# Patient Record
Sex: Female | Born: 1945 | Race: White | Hispanic: No | Marital: Married | Smoking: Never smoker
Health system: Southern US, Community
[De-identification: ages and names within clinical notes are randomized; demographics above are authoritative.]

## PROBLEM LIST (undated history)

## (undated) DIAGNOSIS — M722 Plantar fascial fibromatosis: Secondary | ICD-10-CM

## (undated) DIAGNOSIS — G4761 Periodic limb movement disorder: Secondary | ICD-10-CM

## (undated) DIAGNOSIS — I10 Essential (primary) hypertension: Secondary | ICD-10-CM

## (undated) DIAGNOSIS — M1990 Unspecified osteoarthritis, unspecified site: Secondary | ICD-10-CM

## (undated) DIAGNOSIS — G4701 Insomnia due to medical condition: Secondary | ICD-10-CM

## (undated) DIAGNOSIS — K649 Unspecified hemorrhoids: Secondary | ICD-10-CM

## (undated) DIAGNOSIS — E785 Hyperlipidemia, unspecified: Secondary | ICD-10-CM

## (undated) HISTORY — DX: Unspecified hemorrhoids: K64.9

## (undated) HISTORY — DX: Essential (primary) hypertension: I10

## (undated) HISTORY — DX: Plantar fascial fibromatosis: M72.2

## (undated) HISTORY — DX: Insomnia due to medical condition: G47.01

## (undated) HISTORY — DX: Periodic limb movement disorder: G47.61

## (undated) HISTORY — DX: Unspecified osteoarthritis, unspecified site: M19.90

## (undated) HISTORY — DX: Hyperlipidemia, unspecified: E78.5

---

## 2008-09-10 DIAGNOSIS — C50919 Malignant neoplasm of unspecified site of unspecified female breast: Secondary | ICD-10-CM

## 2008-09-10 HISTORY — DX: Malignant neoplasm of unspecified site of unspecified female breast: C50.919

## 2010-04-20 DIAGNOSIS — C50812 Malignant neoplasm of overlapping sites of left female breast: Secondary | ICD-10-CM | POA: Insufficient documentation

## 2010-04-20 DIAGNOSIS — Z853 Personal history of malignant neoplasm of breast: Secondary | ICD-10-CM | POA: Insufficient documentation

## 2012-11-11 DIAGNOSIS — L509 Urticaria, unspecified: Secondary | ICD-10-CM | POA: Insufficient documentation

## 2012-12-01 DIAGNOSIS — E2839 Other primary ovarian failure: Secondary | ICD-10-CM | POA: Insufficient documentation

## 2012-12-01 DIAGNOSIS — M1990 Unspecified osteoarthritis, unspecified site: Secondary | ICD-10-CM | POA: Insufficient documentation

## 2012-12-01 DIAGNOSIS — D219 Benign neoplasm of connective and other soft tissue, unspecified: Secondary | ICD-10-CM | POA: Insufficient documentation

## 2012-12-01 DIAGNOSIS — B977 Papillomavirus as the cause of diseases classified elsewhere: Secondary | ICD-10-CM | POA: Insufficient documentation

## 2013-12-30 DIAGNOSIS — N952 Postmenopausal atrophic vaginitis: Secondary | ICD-10-CM | POA: Insufficient documentation

## 2014-07-02 DIAGNOSIS — Z87440 Personal history of urinary (tract) infections: Secondary | ICD-10-CM | POA: Insufficient documentation

## 2014-07-02 DIAGNOSIS — G4761 Periodic limb movement disorder: Secondary | ICD-10-CM | POA: Insufficient documentation

## 2014-07-02 DIAGNOSIS — G4701 Insomnia due to medical condition: Secondary | ICD-10-CM | POA: Insufficient documentation

## 2015-02-23 DIAGNOSIS — F329 Major depressive disorder, single episode, unspecified: Secondary | ICD-10-CM | POA: Insufficient documentation

## 2015-02-23 DIAGNOSIS — F32A Depression, unspecified: Secondary | ICD-10-CM | POA: Insufficient documentation

## 2015-11-06 DIAGNOSIS — M8580 Other specified disorders of bone density and structure, unspecified site: Secondary | ICD-10-CM | POA: Insufficient documentation

## 2016-07-02 DIAGNOSIS — M7061 Trochanteric bursitis, right hip: Secondary | ICD-10-CM | POA: Insufficient documentation

## 2016-07-02 DIAGNOSIS — K5901 Slow transit constipation: Secondary | ICD-10-CM | POA: Insufficient documentation

## 2016-07-02 DIAGNOSIS — M7062 Trochanteric bursitis, left hip: Secondary | ICD-10-CM

## 2016-12-04 DIAGNOSIS — R030 Elevated blood-pressure reading, without diagnosis of hypertension: Secondary | ICD-10-CM | POA: Insufficient documentation

## 2016-12-04 DIAGNOSIS — N858 Other specified noninflammatory disorders of uterus: Secondary | ICD-10-CM | POA: Insufficient documentation

## 2016-12-04 DIAGNOSIS — E7889 Other lipoprotein metabolism disorders: Secondary | ICD-10-CM | POA: Insufficient documentation

## 2017-09-10 DIAGNOSIS — M7502 Adhesive capsulitis of left shoulder: Secondary | ICD-10-CM

## 2017-09-10 HISTORY — DX: Adhesive capsulitis of left shoulder: M75.02

## 2017-09-25 DIAGNOSIS — Z9189 Other specified personal risk factors, not elsewhere classified: Secondary | ICD-10-CM | POA: Insufficient documentation

## 2017-10-09 ENCOUNTER — Ambulatory Visit (INDEPENDENT_AMBULATORY_CARE_PROVIDER_SITE_OTHER): Payer: Medicare Other

## 2017-10-09 ENCOUNTER — Ambulatory Visit (INDEPENDENT_AMBULATORY_CARE_PROVIDER_SITE_OTHER): Payer: Medicare Other | Admitting: Podiatry

## 2017-10-09 ENCOUNTER — Other Ambulatory Visit: Payer: Self-pay | Admitting: Podiatry

## 2017-10-09 ENCOUNTER — Encounter: Payer: Self-pay | Admitting: Podiatry

## 2017-10-09 DIAGNOSIS — M7750 Other enthesopathy of unspecified foot: Secondary | ICD-10-CM

## 2017-10-09 DIAGNOSIS — D3610 Benign neoplasm of peripheral nerves and autonomic nervous system, unspecified: Secondary | ICD-10-CM | POA: Diagnosis not present

## 2017-10-09 DIAGNOSIS — M79676 Pain in unspecified toe(s): Secondary | ICD-10-CM

## 2017-10-09 DIAGNOSIS — M79671 Pain in right foot: Secondary | ICD-10-CM

## 2017-10-09 DIAGNOSIS — M79672 Pain in left foot: Secondary | ICD-10-CM | POA: Diagnosis not present

## 2017-10-09 DIAGNOSIS — M779 Enthesopathy, unspecified: Secondary | ICD-10-CM | POA: Diagnosis not present

## 2017-10-09 NOTE — Progress Notes (Signed)
   Subjective:    Patient ID: Nancy Oconnell, female    DOB: [birth year], 72 y.o.   MRN: [id number]  HPI    Review of Systems  All other systems reviewed and are negative.      Objective:   Physical Exam        Assessment & Plan:

## 2017-10-09 NOTE — Progress Notes (Signed)
Subjective:   Patient ID: Nancy Oconnell, female   DOB: 72 y.o.   MRN: [id number]   HPI Patient presents with painful plantar left over right foot with diagnosis of neuroma and states the pain is been present for years and that she likes to run.  Patient does not smoke and likes to be active   Review of Systems  All other systems reviewed and are negative.       Objective:  Physical Exam  Constitutional: She appears well-developed and well-nourished.  Cardiovascular: Intact distal pulses.  Pulmonary/Chest: Effort normal.  Musculoskeletal: Normal range of motion.  Neurological: She is alert.  Skin: Skin is warm.  Nursing note and vitals reviewed.   Neurovascular status intact muscle strength adequate range of motion within normal limits with patient found to have high arch foot structure with exquisite discomfort second and third intermetatarsal space left over right with radiating discomfort into the adjacent digits.  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  At this point on focusing on the Aromasin I did sterile prep of the left foot and injected directly into the nerve roots prior to breaking his digital branches with a purified alcohol Marcaine solution the second third interspace and went ahead today and scanned for orthotics to try to reduce forefoot instability and pressure.  Reappoint when she returns from [place] in 4 weeks  X-ray indicates that there is a moderate high arch foot structure with no indications of advanced pathology

## 2017-11-06 ENCOUNTER — Ambulatory Visit (INDEPENDENT_AMBULATORY_CARE_PROVIDER_SITE_OTHER): Payer: Medicare Other | Admitting: Orthotics

## 2017-11-06 DIAGNOSIS — D3610 Benign neoplasm of peripheral nerves and autonomic nervous system, unspecified: Secondary | ICD-10-CM

## 2017-11-06 DIAGNOSIS — M79672 Pain in left foot: Principal | ICD-10-CM

## 2017-11-06 DIAGNOSIS — M79671 Pain in right foot: Secondary | ICD-10-CM

## 2017-11-07 NOTE — Progress Notes (Signed)
Patient came in today to pick up custom made foot orthotics.  The goals were accomplished and the patient reported no dissatisfaction with said orthotics.  Patient was advised of breakin period and how to report any issues. 

## 2017-11-21 ENCOUNTER — Ambulatory Visit: Payer: Medicare Other | Admitting: Podiatry

## 2017-11-28 ENCOUNTER — Ambulatory Visit (INDEPENDENT_AMBULATORY_CARE_PROVIDER_SITE_OTHER): Payer: Medicare Other | Admitting: Podiatry

## 2017-11-28 ENCOUNTER — Encounter: Payer: Self-pay | Admitting: Podiatry

## 2017-11-28 DIAGNOSIS — D3610 Benign neoplasm of peripheral nerves and autonomic nervous system, unspecified: Secondary | ICD-10-CM

## 2017-11-28 NOTE — Progress Notes (Signed)
Subjective:   Patient ID: Nancy Oconnell, female   DOB: 72 y.o.   MRN: [id number]   HPI Patient presents stating she still getting burning between the third and fourth toes and stated she had relief for around a week and then had recurrence of her symptoms.  States that the pain is shooting and it feels like there is something caught between the metatarsals   ROS      Objective:  Physical Exam  Neurovascular status intact with positive Biagio Borg sign and shooting pain occurring between the third and fourth toes left     Assessment:  Probability for neuroma symptomatology left     Plan:  H&P reviewed condition and at this point recommended neural lysis injection explaining procedure did sterile prep and I then injected directly into the nerve root prior to breaking into digital branches with a purified alcohol Marcaine solution which was tolerated well.  Reappoint 3 weeks

## 2017-12-19 ENCOUNTER — Ambulatory Visit: Payer: Medicare Other | Admitting: Podiatry

## 2017-12-20 ENCOUNTER — Encounter: Payer: Self-pay | Admitting: Podiatry

## 2017-12-20 ENCOUNTER — Ambulatory Visit (INDEPENDENT_AMBULATORY_CARE_PROVIDER_SITE_OTHER): Payer: Medicare Other | Admitting: Podiatry

## 2017-12-20 DIAGNOSIS — D3610 Benign neoplasm of peripheral nerves and autonomic nervous system, unspecified: Secondary | ICD-10-CM | POA: Diagnosis not present

## 2017-12-23 ENCOUNTER — Telehealth: Payer: Self-pay | Admitting: *Deleted

## 2017-12-23 NOTE — Telephone Encounter (Signed)
"  I'm Dr. Mellody Drown patient.  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  Please call me."

## 2017-12-23 NOTE — Progress Notes (Signed)
Subjective:   Patient ID: Nancy Oconnell, female   DOB: 72 y.o.   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  One more injection into the second interspace and I did a proximal nerve block and then using sterile technique I did inject the second interspace with a purified alcohol Marcaine solution and we will reevaluate again and ultimately again surgery will probably be necessary in this particular case

## 2017-12-30 NOTE — Telephone Encounter (Signed)
"  I'm calling to schedule my surgery with Dr. Paulla Dolly.  I'd like to do it the week of May 20.  He said he would walk me in whenever I got ready to schedule."  He can do it on May 21.  "That date will be good.  What time will it be?"  It will be sometime that morning.  I can't give you an exact time.  "Is there anything else I need to do?"  You will need to register with the surgery center, instructions are in the brochure that was given to you.  "I haven't received a brochure."  Have you signed your consent forms?  I don't think I've signed anything."  You are going to need an appointment to see Dr. Paulla Dolly for a consultation.  "Is that not something I can just come in to sign without seeing him?"  No, you have to see him so he can go over the procedure.  We will give you a bag that has instructions for surgery, a brochure to the surgery center, and a scrub brush to clean your foot.  Would you like me to transfer you to a scheduler so you can schedule an appointment?  "Yes, that will be fine."  The call was transferred to [facility].

## 2018-01-20 ENCOUNTER — Ambulatory Visit (INDEPENDENT_AMBULATORY_CARE_PROVIDER_SITE_OTHER): Payer: Medicare Other | Admitting: Podiatry

## 2018-01-20 ENCOUNTER — Encounter: Payer: Self-pay | Admitting: Podiatry

## 2018-01-20 DIAGNOSIS — D3610 Benign neoplasm of peripheral nerves and autonomic nervous system, unspecified: Secondary | ICD-10-CM

## 2018-01-20 NOTE — Patient Instructions (Signed)
Pre-Operative Instructions  Congratulations, you have decided to take an important step towards improving your quality of life.  You can be assured that the doctors and staff at [facility] will be with you every step of the way.  Here are some important things you should know:  1. Plan to be at the surgery center/hospital at least 1 (one) hour prior to your scheduled time, unless otherwise directed by the surgical center/hospital staff.  You must have a responsible adult accompany you, remain during the surgery and drive you home.  Make sure you have directions to the surgical center/hospital to ensure you arrive on time. 2. If you are having surgery at [facility] or [facility] hospitals, you will need a copy of your medical history and physical form from your family physician within one month prior to the date of surgery. We will give you a form for your primary physician to complete.  3. We make every effort to accommodate the date you request for surgery.  However, there are times where surgery dates or times have to be moved.  We will contact you as soon as possible if a change in schedule is required.   4. No aspirin/ibuprofen for one week before surgery.  If you are on aspirin, any non-steroidal anti-inflammatory medications (Mobic, Aleve, Ibuprofen) should not be taken seven (7) days prior to your surgery.  You make take Tylenol for pain prior to surgery.  5. Medications - If you are taking daily heart and blood pressure medications, seizure, reflux, allergy, asthma, anxiety, pain or diabetes medications, make sure you notify the surgery center/hospital before the day of surgery so they can tell you which medications you should take or avoid the day of surgery. 6. No food or drink after midnight the night before surgery unless directed otherwise by surgical center/hospital staff. 7. No alcoholic beverages 24-hours prior to surgery.  No smoking 24-hours prior or 24-hours after  surgery. 8. Wear loose pants or shorts. They should be loose enough to fit over bandages, boots, and casts. 9. Don't wear slip-on shoes. Sneakers are preferred. 10. Bring your boot with you to the surgery center/hospital.  Also bring crutches or a walker if your physician has prescribed it for you.  If you do not have this equipment, it will be provided for you after surgery. 11. If you have not been contacted by the surgery center/hospital by the day before your surgery, call to confirm the date and time of your surgery. 12. Leave-time from work may vary depending on the type of surgery you have.  Appropriate arrangements should be made prior to surgery with your employer. 13. Prescriptions will be provided immediately following surgery by your doctor.  Fill these as soon as possible after surgery and take the medication as directed. Pain medications will not be refilled on weekends and must be approved by the doctor. 14. Remove nail polish on the operative foot and avoid getting pedicures prior to surgery. 15. Wash the night before surgery.  The night before surgery wash the foot and leg well with water and the antibacterial soap provided. Be sure to pay special attention to beneath the toenails and in between the toes.  Wash for at least three (3) minutes. Rinse thoroughly with water and dry well with a towel.  Perform this wash unless told not to do so by your physician.  Enclosed: 1 Ice pack (please put in freezer the night before surgery)   1 Hibiclens skin cleaner     Pre-op instructions  If you have any questions regarding the instructions, please do not hesitate to call our office.  [place]: [address], [place], [place] [address] -- [phone]  [place]: [address] [place], [place] [address] -- [phone]  [place]: [address]  [place], [place] [address] -- [phone]  [place]: [address], [address], [place], [place] [address] -- [phone]  Website:  [email or web address] 

## 2018-01-21 ENCOUNTER — Telehealth: Payer: Self-pay | Admitting: *Deleted

## 2018-01-21 NOTE — Telephone Encounter (Addendum)
"  I'm scheduled for surgery on May 21.  I'm going to need to cancel it."  Is there a particular reason of why you want to cancel your surgery?  "When I scheduled it, I didn't realize I would need so much down time until I saw him and he told me yesterday.  I'm going to hold off for now."  I will let Dr. Paulla Dolly know and I will cancel the appointment at the surgical center.  I called Caren Griffins at [facility] and canceled the surgery.

## 2018-01-21 NOTE — Telephone Encounter (Signed)
Post op visit was cancelled.

## 2018-01-23 NOTE — Progress Notes (Signed)
Subjective:   Patient ID: Nancy Oconnell, female   DOB: 72 y.o.   MRN: [id number]   HPI Patient presents with exquisite discomfort second interspace left with chronic neuroma-like symptomatology that is not responded conservatively   ROS      Objective:  Physical Exam  Neurovascular status intact with exquisite discomfort second interspace left with what appears to be space-occupying lesion     Assessment:  Probable neuroma symptomatology left second interspace     Plan:  Allow patient to read consent form going over alternative treatments complications.  I explained there is no guarantee this will solve the problem is possible she still may have some symptoms of condition and at this point after extensive review patient signed consent form is given preoperative instructions.  Patient is scheduled for outpatient surgery and understands recovery can take upwards of 6 months and is encouraged to call us with any questions prior to procedure

## 2018-02-05 ENCOUNTER — Other Ambulatory Visit: Payer: Medicare Other

## 2018-10-01 ENCOUNTER — Ambulatory Visit (INDEPENDENT_AMBULATORY_CARE_PROVIDER_SITE_OTHER): Payer: Medicare Other | Admitting: Orthopedic Surgery

## 2018-10-01 ENCOUNTER — Encounter (INDEPENDENT_AMBULATORY_CARE_PROVIDER_SITE_OTHER): Payer: Self-pay

## 2019-09-30 ENCOUNTER — Ambulatory Visit: Payer: Medicare Other | Attending: Family Medicine

## 2019-09-30 DIAGNOSIS — Z23 Encounter for immunization: Secondary | ICD-10-CM | POA: Insufficient documentation

## 2019-09-30 NOTE — Progress Notes (Signed)
   Covid-19 Vaccination Clinic  Name:  Nancy Oconnell    MRN: [id number] DOB: 05-14-1946  09/30/2019  [name] was observed post Covid-19 immunization for 15 minutes without incidence. She was provided with Vaccine Information Sheet and instruction to access the V-Safe system.   Ms. Mean was instructed to call 911 with any severe reactions post vaccine: [name] Difficulty breathing  . Swelling of your face and throat  . A fast heartbeat  . A bad rash all over your body  . Dizziness and weakness    Immunizations Administered    Name Date Dose VIS Date Route   Pfizer COVID-19 Vaccine 09/30/2019 11:17 AM 0.3 mL 08/21/2019 Intramuscular   Manufacturer: [place]   Lot: [id number]   [place]: [id number]

## 2019-10-19 ENCOUNTER — Ambulatory Visit: Payer: Medicare Other | Attending: Internal Medicine

## 2019-10-19 DIAGNOSIS — Z23 Encounter for immunization: Secondary | ICD-10-CM

## 2019-10-19 NOTE — Progress Notes (Signed)
   Covid-19 Vaccination Clinic  Name:  Nancy Oconnell    MRN: [id number] DOB: 1946-07-05  10/19/2019  Nancy Oconnell was observed post Covid-19 immunization for 15 minutes without incidence. She was provided with Vaccine Information Sheet and instruction to access the V-Safe system.   Nancy Oconnell was instructed to call 911 with any severe reactions post vaccine: [name] Difficulty breathing  . Swelling of your face and throat  . A fast heartbeat  . A bad rash all over your body  . Dizziness and weakness    Immunizations Administered    Name Date Dose VIS Date Route   Pfizer COVID-19 Vaccine 10/19/2019  4:13 PM 0.3 mL 08/21/2019 Intramuscular   Manufacturer: [place]   Lot: [id number]   [place]: [id number]

## 2019-10-24 ENCOUNTER — Other Ambulatory Visit: Payer: Self-pay

## 2019-10-24 ENCOUNTER — Emergency Department (HOSPITAL_COMMUNITY)
Admission: EM | Admit: 2019-10-24 | Discharge: 2019-10-24 | Disposition: A | Discharge status: Home / Self Care | Payer: Medicare Other | Attending: Emergency Medicine | Admitting: Emergency Medicine

## 2019-10-24 ENCOUNTER — Encounter (HOSPITAL_COMMUNITY): Payer: Self-pay

## 2019-10-24 ENCOUNTER — Emergency Department (HOSPITAL_COMMUNITY): Payer: Medicare Other

## 2019-10-24 DIAGNOSIS — R0602 Shortness of breath: Secondary | ICD-10-CM | POA: Diagnosis present

## 2019-10-24 LAB — CBC WITH DIFFERENTIAL/PLATELET
Abs Immature Granulocytes: 0.01 10*3/uL (ref 0.00–0.07)
Basophils Absolute: 0 10*3/uL (ref 0.0–0.1)
Basophils Relative: 0 %
Eosinophils Absolute: 0.1 10*3/uL (ref 0.0–0.5)
Eosinophils Relative: 2 %
HCT: 41.3 % (ref 36.0–46.0)
Hemoglobin: 13.7 g/dL (ref 12.0–15.0)
Immature Granulocytes: 0 %
Lymphocytes Relative: 31 %
Lymphs Abs: 1.6 10*3/uL (ref 0.7–4.0)
MCH: 30.4 pg (ref 26.0–34.0)
MCHC: 33.2 g/dL (ref 30.0–36.0)
MCV: 91.8 fL (ref 80.0–100.0)
Monocytes Absolute: 0.5 10*3/uL (ref 0.1–1.0)
Monocytes Relative: 9 %
Neutro Abs: 3 10*3/uL (ref 1.7–7.7)
Neutrophils Relative %: 58 %
Platelets: 175 10*3/uL (ref 150–400)
RBC: 4.5 MIL/uL (ref 3.87–5.11)
RDW: 13.1 % (ref 11.5–15.5)
WBC: 5.1 10*3/uL (ref 4.0–10.5)
nRBC: 0 % (ref 0.0–0.2)

## 2019-10-24 LAB — COMPREHENSIVE METABOLIC PANEL
ALT: 18 U/L (ref 0–44)
AST: 20 U/L (ref 15–41)
Albumin: 4.2 g/dL (ref 3.5–5.0)
Alkaline Phosphatase: 61 U/L (ref 38–126)
Anion gap: 10 (ref 5–15)
BUN: 14 mg/dL (ref 8–23)
CO2: 26 mmol/L (ref 22–32)
Calcium: 9.8 mg/dL (ref 8.9–10.3)
Chloride: 106 mmol/L (ref 98–111)
Creatinine, Ser: 0.57 mg/dL (ref 0.44–1.00)
GFR calc Af Amer: >60 mL/min (ref >60)
GFR calc non Af Amer: >60 mL/min (ref >60)
Glucose, Bld: 107 mg/dL — ABNORMAL HIGH (ref 70–99)
Potassium: 3.9 mmol/L (ref 3.5–5.1)
Sodium: 142 mmol/L (ref 135–145)
Total Bilirubin: 0.2 mg/dL — ABNORMAL LOW (ref 0.3–1.2)
Total Protein: 7.1 g/dL (ref 6.5–8.1)

## 2019-10-24 LAB — D-DIMER, QUANTITATIVE: D-Dimer, Quant: <0.27 ug/mL-FEU (ref 0.00–0.50)

## 2019-10-24 LAB — TROPONIN I (HIGH SENSITIVITY): Troponin I (High Sensitivity): 3 ng/L (ref <18)

## 2019-10-24 LAB — BRAIN NATRIURETIC PEPTIDE: B Natriuretic Peptide: 61.2 pg/mL (ref 0.0–100.0)

## 2019-10-24 NOTE — ED Provider Notes (Signed)
Emergency Department Provider Note   I have reviewed the triage vital signs and the nursing notes.   HISTORY  Chief Complaint Covid Vaccine reaction   HPI Nancy Oconnell is a 74 y.o. female with PMH of breast cancer on Tamoxifen presents to the emergency department for evaluation of shortness of breath over the past week.  Patient reports symptoms starting shortly after receiving her second COVID-19 vaccination.  She did notice some mild shortness of breath after the first vaccine which resolved quickly and was not initially concerning.  She had the second vaccine and reports that the next day she developed shortness of breath which seems to worsen throughout the day and is bad especially in the evenings.  Symptoms are worse when she is sitting and improved with getting up and moving around.  She denies chest pain, heart palpitations.  She is not having fevers, chills, sore throat.  She is not noticing swelling in her legs.  No history of shortness of breath in the past.  No new medications.  She denies itchy rash, tongue/throat swelling.     History reviewed. No pertinent past medical history.  Patient Active Problem List   Diagnosis Date Noted  . 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 Elevated HDL 12/04/2016  . Transient hypertension 12/04/2016  . Uterine mass 12/04/2016  . Slow transit constipation 07/02/2016  . Trochanteric bursitis of both hips 07/02/2016  . Osteopenia determined by x-ray 11/06/2015  . Depression 02/23/2015  . History of recurrent UTI (urinary tract infection) 07/02/2014  . 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 Periodic limb movement disorder (PLMD) 07/02/2014  . Vaginal atrophy 12/30/2013  . Arthritis 12/01/2012  . Estrogen deficiency 12/01/2012  . Fibroids 12/01/2012  . 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 Localized hives 11/11/2012  . History of left breast cancer 04/20/2010  . Malignant neoplasm of overlapping sites of left female breast ([place])  04/20/2010    History reviewed. No pertinent surgical history.  Allergies Other, Alendronate, Lisinopril, Mirtazapine, and Adhesive [tape]  History reviewed. No pertinent family history.  Social History Social History   Tobacco Use  . Smoking status: Unknown If Ever Smoked  . Smokeless tobacco: Never Used  Substance Use Topics  . Alcohol use: Not on file  . Drug use: Not on file    Review of Systems  Constitutional: No fever/chills Eyes: No visual changes. ENT: No sore throat. Cardiovascular: Denies chest pain. Respiratory: Positive shortness of breath. Gastrointestinal: No abdominal pain.  No nausea, no vomiting.  No diarrhea.  No constipation. Genitourinary: Negative for dysuria. Musculoskeletal: Negative for back pain. Skin: Negative for rash. Neurological: Negative for headaches, focal weakness or numbness.  10-point ROS otherwise negative.  ____________________________________________   PHYSICAL EXAM:  VITAL SIGNS: ED Triage Vitals  Enc Vitals Group     BP 10/24/19 1109 (!) 162/110     Pulse Rate 10/24/19 1109 (!) 115     Resp 10/24/19 1109 18     Temp 10/24/19 1109 98.4 F (36.9 C)     Temp Source 10/24/19 1109 Oral     SpO2 10/24/19 1109 98 %   Constitutional: Alert and oriented. Well appearing and in no acute distress. Eyes: Conjunctivae are normal.  Head: Atraumatic. Nose: No congestion/rhinnorhea. Mouth/Throat: Mucous membranes are moist.  Oropharynx non-erythematous. No oropharyngeal edema.  Neck: No stridor.   Cardiovascular: Tachycardia. Good peripheral circulation. Grossly normal heart sounds.   Respiratory: Normal respiratory effort.  No retractions. Lungs CTAB. Gastrointestinal: Soft and nontender.  No distention.  Musculoskeletal: No lower extremity tenderness nor edema.  Neurologic:  Normal speech and language.  Skin:  Skin is warm, dry and intact. No rash noted.  ____________________________________________   LABS (all labs ordered  are listed, but only abnormal results are displayed)  Labs Reviewed  COMPREHENSIVE METABOLIC PANEL - Abnormal; Notable for the following components:      Result Value   Glucose, Bld 107 (*)    Total Bilirubin 0.2 (*)    All other components within normal limits  BRAIN NATRIURETIC PEPTIDE  CBC WITH DIFFERENTIAL/PLATELET  D-DIMER, QUANTITATIVE (NOT AT [facility])  TROPONIN I (HIGH SENSITIVITY)   ____________________________________________  EKG   EKG Interpretation  Date/Time:  Saturday October 24 2019 12:12:42 EST Ventricular Rate:  86 PR Interval:  150 QRS Duration: 78 QT Interval:  370 QTC Calculation: 442 R Axis:   81 Text Interpretation: Normal sinus rhythm Nonspecific ST abnormality Abnormal ECG No STEMI Confirmed by Nanda Quinton [phone]) on 10/24/2019 12:17:30 PM       ____________________________________________  RADIOLOGY  DG Chest Portable 1 View  Result Date: 10/24/2019 CLINICAL DATA:  Shortness of breath. Possible reaction a COVID vaccine. EXAM: PORTABLE CHEST 1 VIEW COMPARISON:  None. FINDINGS: Heart size and mediastinal contours are within normal limits. Lungs are clear. No pleural effusion or pneumothorax is seen. Osseous structures about the chest are unremarkable. IMPRESSION: No active disease. No evidence of pneumonia. Electronically Signed   By: Franki Cabot M.D.   On: 10/24/2019 12:13    ____________________________________________   PROCEDURES  Procedure(s) performed:   Procedures  None  ____________________________________________   INITIAL IMPRESSION / ASSESSMENT AND PLAN / ED COURSE  Pertinent labs & imaging results that were available during my care of the patient were reviewed by me and considered in my medical decision making (see chart for details).   Patient presents emergency department for evaluation of shortness of breath.  She associates the symptoms with her COVID-19 vaccine but pattern is atypical with symptoms worsening throughout  the day and improved with exertion.  No other symptoms to suspect acute vaccine reaction.  Given her history of breast cancer on tamoxifen I have added a D-dimer in addition to screening blood work, EKG, chest x-ray.  Patient's initial vital signs show tachycardia and elevated blood pressure without fever and normal oxygen saturation.   Labs and imaging reviewed. No acute findings. Plan for discharge. Patient has Cardiology follow up next week. Encouraged PCP follow up as well.  ____________________________________________  FINAL CLINICAL IMPRESSION(S) / ED DIAGNOSES  Final diagnoses:  SOB (shortness of breath)     Note:  This document was prepared using Dragon voice recognition software and may include unintentional dictation errors.  Nanda Quinton, MD, [facility] Emergency Medicine    Tynslee Bowlds, [facility], MD 10/24/19 [phone]

## 2019-10-24 NOTE — ED Triage Notes (Signed)
Pt presents with c/o possible reaction to vaccine. Pt reports she had her second covid shot on Monday and since then has been having some shortness of breath. No distress noted, pt able to answer questions and talk in complete sentences.

## 2019-10-24 NOTE — Discharge Instructions (Addendum)
You were seen in the ED today with difficulty breathing. Your labs look normal and your chest x-ray was clear. Please keep your Cardiology appointment next week and call your PCP on Monday to schedule the next available follow up appointment to review your symptoms, ED visit, and direct further testing as needed. Return to the ED with any chest pain, worsening shortness of breath, or other severe symptoms.

## 2019-10-27 ENCOUNTER — Ambulatory Visit (INDEPENDENT_AMBULATORY_CARE_PROVIDER_SITE_OTHER): Payer: Medicare Other | Admitting: Cardiology

## 2019-10-27 ENCOUNTER — Other Ambulatory Visit: Payer: Self-pay

## 2019-10-27 ENCOUNTER — Encounter: Payer: Self-pay | Admitting: Cardiology

## 2019-10-27 VITALS — BP 138/76 | HR 90 | Ht 63.0 in | Wt 124.8 lb

## 2019-10-27 DIAGNOSIS — R002 Palpitations: Secondary | ICD-10-CM | POA: Diagnosis not present

## 2019-10-27 DIAGNOSIS — R030 Elevated blood-pressure reading, without diagnosis of hypertension: Secondary | ICD-10-CM

## 2019-10-27 DIAGNOSIS — E782 Mixed hyperlipidemia: Secondary | ICD-10-CM | POA: Diagnosis not present

## 2019-10-27 DIAGNOSIS — R0602 Shortness of breath: Secondary | ICD-10-CM
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
## 2019-10-27 NOTE — Patient Instructions (Signed)
Medication Instructions:   NO CHANGES  *If you need a refill on your cardiac medications before your next appointment, please call your pharmacy*  Lab Work: NOT  NEEDED   Testing/Procedures: WILL BE SCHEDULE AT [address] Your physician has requested that you have an echocardiogram. Echocardiography is a painless test that uses sound waves to create images of your heart. It provides your doctor with information about the size and shape of your heart and how well your heart's chambers and valves are working. This procedure takes approximately one hour. There are no restrictions for this procedure. And  CT coronary calcium score. . This is $150 out of pocket.   Coronary CalciumScan A coronary calcium scan is an imaging test used to look for deposits of calcium and other fatty materials (plaques) in the inner lining of the blood vessels of the heart (coronary arteries). These deposits of calcium and plaques can partly clog and narrow the coronary arteries without producing any symptoms or warning signs. This puts a person at risk for a heart attack. This test can detect these deposits before symptoms develop. Tell a health care provider about:  Any allergies you have.  All medicines you are taking, including vitamins, herbs, eye drops, creams, and over-the-counter medicines.  Any problems you or family members have had with anesthetic medicines.  Any blood disorders you have.  Any surgeries you have had.  Any medical conditions you have.  Whether you are pregnant or may be pregnant. What are the risks? Generally, this is a safe procedure. However, problems may occur, including:  Harm to a pregnant woman and her unborn baby. This test involves the use of radiation. Radiation exposure can be dangerous to a pregnant woman and her unborn baby. If you are pregnant, you generally should not have this procedure done.  Slight increase in the risk of cancer. This is  because of the radiation involved in the test. What happens before the procedure? No preparation is needed for this procedure. What happens during the procedure?  You will undress and remove any jewelry around your neck or chest.  You will put on a hospital gown.  Sticky electrodes will be placed on your chest. The electrodes will be connected to an electrocardiogram (ECG) machine to record a tracing of the electrical activity of your heart.  A CT scanner will take pictures of your heart. During this time, you will be asked to lie still and hold your breath for 2-3 seconds while a picture of your heart is being taken. The procedure may vary among health care providers and hospitals. What happens after the procedure?  You can get dressed.  You can return to your normal activities.  It is up to you to get the results of your test. Ask your health care provider, or the department that is doing the test, when your results will be ready. Summary  A coronary calcium scan is an imaging test used to look for deposits of calcium and other fatty materials (plaques) in the inner lining of the blood vessels of the heart (coronary arteries).  Generally, this is a safe procedure. Tell your health care provider if you are pregnant or may be pregnant.  No preparation is needed for this procedure.  A CT scanner will take pictures of your heart.  You can return to your normal activities after the scan is done. This information is not intended to replace advice given to you by your health care  provider. Make sure you discuss any questions you have with your health care provider. Document Released: 02/23/2008 Document Revised: 07/16/2016 Document Reviewed: 07/16/2016 Elsevier Interactive Patient Education  2017 [place]: At [facility], you and your health needs are our priority.  As part of our continuing mission to provide you with exceptional heart care, we have created  designated Provider Care Teams.  These Care Teams include your primary Cardiologist (physician) and Advanced Practice Providers (APPs -  Physician Assistants and Nurse Practitioners) who all work together to provide you with the care you need, when you need it.  Your next appointment:   1 month(s)  The format for your next appointment:   In Person  Provider:   Glenetta Hew, MD  Other Instructions  for your palpitation  Use your apple watch to monitor . If the  Symptoms increase and more pronounce  Please notify office and  An Zio  Monitor will be ordered  Here is some information about it Your physician has recommended that you wear a 3- 14  DAY ZIO-PATCH monitor. The Zio patch cardiac monitor continuously records heart rhythm data for up to 14 days, this is for patients being evaluated for multiple types heart rhythms. For the first 24 hours post application, please avoid getting the Zio monitor wet in the shower or by excessive sweating during exercise. After that, feel free to carry on with regular activities. Keep soaps and lotions away from the ZIO XT Patch.  This will be mailed to you, please expect 7-10 days to receive.  [employer] location - [place], [address].

## 2019-10-29 ENCOUNTER — Encounter: Payer: Self-pay | Admitting: Cardiology

## 2019-10-29 NOTE — Assessment & Plan Note (Signed)
She does have an LDL of 111 by last check.  Total cholesterol is less than 200 so in the absence of active symptoms or risk factors, I do not know we need to be overly aggressive.  We talked about potential screening options to determine if we need to be more aggressive in treating lipids.  Stress tests and high sensitive CRP etc. are probably not very functional for screening.  Most beneficial test to assess for potential presence of CAD would be a coronary calcium score.  Plan: Check coronary calcium score.  Based on results, we can determine if her target for LDL should be lower or not.

## 2019-10-29 NOTE — Assessment & Plan Note (Addendum)
She describes having short-lived episodes of racing heart rates that are irregular in nature as far as her heart race ago.  It does sound potentially concerning for atrial fibrillation.  I am not sure how effective the monitor would be because of not having as much for now.  She does have the apple watch model for which does have a telemetry tracking application which she just needs to activate.  Hopefully this will work for her to be allowed capture an episode of erratic heart rates.  I do not necessarily want her to go drinking a lot of alcohol but may be if she does have a drink and has an episode she can bring into telemetry strip.  We will prefer to not treat unless I know what we are treating.  She may benefit from a beta-blocker with resting heart rate of 90 bpm, and lots of anxiety.  Of concern is that both her brother and father had A. fib and erratic heart rates sounds like it very well could be A. fib.  Would be nice to monitor.

## 2019-10-29 NOTE — Assessment & Plan Note (Signed)
Difficult to really assess what her shortness of breath is all about.  The timing of when her symptoms calm makes it less likely be cardiac in nature.  However we can is seen anything pressure.  It could be related to tachycardia.  Plan: Check 2D echocardiogram to evaluate baseline cardiac function.

## 2019-10-29 NOTE — Assessment & Plan Note (Signed)
I think the very fact that she has recordings as well with the 0000000 systolic, I would be very reluctant to put her on a blood pressure medication I agree with having stopped amlodipine.  Perhaps she would benefit from having a as needed short acting medication for uncontrolled hypertension, but it seems like if she sits down and rests, her hypertensive pressures usually resolved.

## 2019-11-09 HISTORY — PX: OTHER SURGICAL HISTORY: SHX169

## 2019-11-09 HISTORY — PX: TRANSTHORACIC ECHOCARDIOGRAM: SHX275

## 2019-11-11 ENCOUNTER — Other Ambulatory Visit: Payer: Self-pay

## 2019-11-11 ENCOUNTER — Ambulatory Visit (INDEPENDENT_AMBULATORY_CARE_PROVIDER_SITE_OTHER)
Admission: RE | Admit: 2019-11-11 | Discharge: 2019-11-11 | Disposition: A | Discharge status: Home / Self Care | Payer: Self-pay | Source: Ambulatory Visit | Attending: Cardiology | Admitting: Cardiology

## 2019-11-11 ENCOUNTER — Ambulatory Visit (HOSPITAL_COMMUNITY): Payer: Medicare Other | Attending: Cardiovascular Disease

## 2019-11-11 DIAGNOSIS — E782 Mixed hyperlipidemia: Secondary | ICD-10-CM

## 2019-11-11 DIAGNOSIS — R002 Palpitations: Secondary | ICD-10-CM

## 2019-11-11 DIAGNOSIS — R0602 Shortness of breath: Secondary | ICD-10-CM | POA: Diagnosis not present

## 2019-11-11 DIAGNOSIS — I10 Essential (primary) hypertension: Secondary | ICD-10-CM | POA: Diagnosis not present

## 2019-11-11 DIAGNOSIS — E785 Hyperlipidemia, unspecified: Secondary | ICD-10-CM | POA: Insufficient documentation

## 2019-11-12 NOTE — Telephone Encounter (Signed)
My Chart:  From: [name] "Nancy Oconnell"    Created: 11/10/2019 7:29 AM     *-*-*This message has not been handled.*-*-*  I have attached an ECG recorded on my Apple Watch. I am sending this because: I captured an episode of Afib last night and you asked me to send it to you. [address]    This patients CHA2DS2-VASc Score and unadjusted Ischemic Stroke Rate (% per year) is equal to 3.2 % stroke rate/year from a score of 3  Above score calculated as 1 point each if present [CHF, HTN, DM, Vascular=MI/PAD/Aortic Plaque, Age if 65-74, or Female] Above score calculated as 2 points each if present [Age > 75, or Stroke/TIA/TE]  This does indeed look like A. Fib.  Amazing with these watches can do for [place] these days. How long that the episode last?   You echocardiogram was done and that look great.  Nothing abnormal.  Normal pump function and normal valves.  We are still waiting on the other test. ->  Now that we have confirmed A. fib, we do need to evaluate for heart artery disease.  Given your age, being a woman and having high blood pressure, you are at increased risk for stroke with A. fib as we discussed.  We probably ought to consider having you on a blood thinner.   My recommendation would be either Eliquis 5 mg twice a day or Xarelto 20 mg daily as the new most commonly used options for blood thinners with A. Fib.  We could also talk about having you on a medication to keep your heart rate a little bit more controlled.  I would prefer to talk about these options with you in person.  I am scheduled to see you back in a couple weeks and we can discuss treatment plans going forward.  Glenetta Hew, MD

## 2019-11-17 ENCOUNTER — Encounter (HOSPITAL_COMMUNITY): Payer: Self-pay | Admitting: Physician Assistant

## 2019-11-17 ENCOUNTER — Ambulatory Visit (HOSPITAL_COMMUNITY)
Admission: RE | Admit: 2019-11-17 | Discharge: 2019-11-17 | Disposition: A | Discharge status: Home / Self Care | Payer: Medicare Other | Source: Ambulatory Visit | Attending: Physician Assistant | Admitting: Physician Assistant

## 2019-11-17 ENCOUNTER — Other Ambulatory Visit: Payer: Self-pay

## 2019-11-17 VITALS — BP 140/88 | HR 88 | Ht 63.0 in | Wt 122.6 lb

## 2019-11-17 DIAGNOSIS — R9431 Abnormal electrocardiogram [ECG] [EKG]: Secondary | ICD-10-CM | POA: Insufficient documentation

## 2019-11-17 DIAGNOSIS — Z853 Personal history of malignant neoplasm of breast: Secondary | ICD-10-CM | POA: Insufficient documentation

## 2019-11-17 DIAGNOSIS — E785 Hyperlipidemia, unspecified: Secondary | ICD-10-CM | POA: Diagnosis not present

## 2019-11-17 DIAGNOSIS — I480 Paroxysmal atrial fibrillation: Secondary | ICD-10-CM | POA: Insufficient documentation

## 2019-11-17 DIAGNOSIS — I10 Essential (primary) hypertension: Secondary | ICD-10-CM | POA: Diagnosis not present

## 2019-11-17 DIAGNOSIS — G4730 Sleep apnea, unspecified: Secondary | ICD-10-CM | POA: Diagnosis not present

## 2019-11-17 DIAGNOSIS — Z79899 Other long term (current) drug therapy: Secondary | ICD-10-CM | POA: Diagnosis not present

## 2019-11-17 DIAGNOSIS — D6869 Other thrombophilia: Secondary | ICD-10-CM | POA: Insufficient documentation

## 2019-11-17 DIAGNOSIS — Z7901 Long term (current) use of anticoagulants: Secondary | ICD-10-CM | POA: Diagnosis not present

## 2019-11-17 HISTORY — DX: Paroxysmal atrial fibrillation: I48.0

## 2019-11-17 MED ORDER — APIXABAN 5 MG PO TABS: 5.0000 mg | ORAL_TABLET | Freq: Two times a day (BID) | ORAL | 3 refills | Status: DC

## 2019-11-17 MED ORDER — METOPROLOL TARTRATE 25 MG PO TABS: 12.5000 mg | ORAL_TABLET | Freq: Two times a day (BID) | ORAL | 3 refills | Status: DC

## 2019-11-17 NOTE — Patient Instructions (Addendum)
Start Eliquis 5mg  twice a day  Start Metoprolol 1/2 tablet twice a day (may take extra 1/2 tablet as needed for breakthrough afib)  Stop caduet (amlodipine/atorvastatin)  Sleep study scheduling will call once approved by your insurance.
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
## 2019-11-18 ENCOUNTER — Telehealth: Payer: Self-pay | Admitting: *Deleted

## 2019-11-18 NOTE — Telephone Encounter (Signed)
Patient notified of COVID and sleep study appointments. She voiced understanding of quarantine instructions.

## 2019-11-26 ENCOUNTER — Encounter: Payer: Self-pay | Admitting: Cardiology

## 2019-11-26 ENCOUNTER — Ambulatory Visit (INDEPENDENT_AMBULATORY_CARE_PROVIDER_SITE_OTHER): Payer: Medicare Other | Admitting: Cardiology

## 2019-11-26 ENCOUNTER — Other Ambulatory Visit: Payer: Self-pay

## 2019-11-26 VITALS — BP 110/60 | HR 63 | Temp 97.5°F | Ht 63.0 in | Wt 124.4 lb

## 2019-11-26 DIAGNOSIS — I480 Paroxysmal atrial fibrillation: Secondary | ICD-10-CM

## 2019-11-26 DIAGNOSIS — R030 Elevated blood-pressure reading, without diagnosis of hypertension: Secondary | ICD-10-CM

## 2019-11-26 DIAGNOSIS — R0602 Shortness of breath: Secondary | ICD-10-CM

## 2019-11-26 DIAGNOSIS — D6869 Other thrombophilia: Secondary | ICD-10-CM

## 2019-11-26 DIAGNOSIS — E782 Mixed hyperlipidemia: Secondary | ICD-10-CM | POA: Diagnosis not present

## 2019-11-26 NOTE — Patient Instructions (Signed)
Medication Instructions:   Continue with current medications especially Eliquis   If you have breakthrough of afib that las to the next morning notify office.   *If you need a refill on your cardiac medications before your next appointment, please call your pharmacy*   Lab Work: Not needed    Testing/Procedures: Not needed   Follow-Up: At [facility], you and your health needs are our priority.  As part of our continuing mission to provide you with exceptional heart care, we have created designated Provider Care Teams.  These Care Teams include your primary Cardiologist (physician) and Advanced Practice Providers (APPs -  Physician Assistants and Nurse Practitioners) who all work together to provide you with the care you need, when you need it.     Your next appointment:   4 month(s)  The format for your next appointment:   In Person  Provider:   Glenetta Hew, MD   Other Instructions  May have occasional glass of wine
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
## 2019-11-30 ENCOUNTER — Encounter: Payer: Self-pay | Admitting: Cardiology

## 2019-11-30 NOTE — Assessment & Plan Note (Signed)
New diagnosis.  Short runs based on her symptoms lasting anywhere from about 20 minutes up to 4 hours. Rates were fast.  She is only on low-dose beta-blocker.  She is on Eliquis which would allow [place] to potentially consider cardioversion.  I want to see what her actual burden is on the beta-blocker.  May need to consider pill in the pocket flecainide if she does have lots of recurrences.  I have asked her to follow these rhythms, and if they persist long enough to go on to the next day, then you may want to consider cardioversion.  However if they are happening more frequently but still short, would probably consider rhythm control with flecainide or propafenone.

## 2019-11-30 NOTE — Assessment & Plan Note (Signed)
She does have up-and-down blood pressures.  I would be leery of treating too aggressively, because her pressures can be low.  I agree with having him stop the amlodipine.  Now she is on Lopressor.  Not sure how far we can push her beta-blocker  with her resting heart rate of 63, but for now would just maintain low-dose beta-blocker.

## 2019-11-30 NOTE — Assessment & Plan Note (Signed)
She does have history of cancer which would have given her hypercoagulable state, but now she has A. fib.  Is on chronic anticoagulation with CHA2DS2-VASc of 3.

## 2019-11-30 NOTE — Assessment & Plan Note (Signed)
Probably had a lot to do with anxiety.  Had normal echocardiogram and normal coronary calcium score.  Likely if not from arrhythmia, is probably related to anxiety or noncardiac etiology.

## 2019-11-30 NOTE — Assessment & Plan Note (Signed)
Cholesterol looks pretty good based on her coronary calcium score.  Would probably try to get below 100, but that can likely be done with diet and exercise.  Can avoid medications at this time.

## 2019-12-03 ENCOUNTER — Telehealth (HOSPITAL_COMMUNITY): Payer: Self-pay

## 2019-12-03 NOTE — Telephone Encounter (Signed)
Patient called and states she recently started on the Metoprolol Tartrate 25mg  tablet on 11/17/19. She is having constipation and only sleeping 3-4 hours at night. She was wondering if this is related to this medication. Per Tilda Franco she needs to stop the medication Metoprolol and only take 1/2 tablet for breakthrough afib. She can restart the medication in three days and contact her pcp if she is no better. Consulted with patient and she understood.

## 2019-12-04 ENCOUNTER — Telehealth: Payer: Self-pay | Admitting: Physician Assistant

## 2019-12-04 ENCOUNTER — Telehealth: Payer: Self-pay | Admitting: Internal Medicine

## 2019-12-04 MED ORDER — DILTIAZEM HCL ER COATED BEADS 120 MG PO CP24: 120.0000 mg | ORAL_CAPSULE | Freq: Every day | ORAL | 0 refills | Status: DC

## 2019-12-04 NOTE — Telephone Encounter (Addendum)
   The patient called the answering service after-hours today. Delay in callback due to call volume and phone number issue. Had recently diagnosed atrial fibrillation. Saw atrial fib clinic with plan for metoprolol 12.5mg  BID and Eliquis. She was having trouble sleeping which was felt possibly related to metoprolol. She says she spoke with AFib clinic yesterday and they recommended to discontinue metoprolol and only take 1/2 tablet PRN breakthrough afib. She is on Eliquis as well.  She did not take metoprolol last night or this morning. She thought she went out of rhythm again this evening - she just tends to feel crummy when in atrial fibrillation. She does not overtly feel palpitations but has noticed correlation using Apple watch. No CP, SOB or other acute symptoms. HR running 111-120s. BP 150/101. She took the 1/2 tablet of metoprolol but irregular HR persists. She is looking for next steps or alternative option. We discussed options including seeking care acutely in ED if having progressive symptoms versus trial of alternative med as outpatient if she simply feels these are the same symptoms she's had recently. She feels the latter is appropriate. We will try a trial of diltiazem CD 120mg  daily. I told her if symptoms do not improve within a few hours of taking the medicine to call us back to discuss next steps. She will hold off further metoprolol for now, pending how she does on diltiazem.  Will route to Dr. Ellyn Hack and Audry Pili in Afib clinic to review and advise on further steps. Might suggest considering updated labs to ensure no new metabolic changes otherwise including thyroid perhaps given disruption in sleep recently.  Pt's pharmacy is closed now so per our discussion she will use [facility]. She was grateful for call.  Charlie Pitter, PA-C

## 2019-12-04 NOTE — Telephone Encounter (Signed)
Cardiology Moonlighter Note  Patient calling to follow up from phone call earlier. Still in AF. BP 136/82, HR 125 bpm. Feeling minimal palpitations but mostly asymptomatic. Hasn't been sleeping well lately so she feels tired. Took diltiazem 120mg  (sustained release) at 8pm, took 12.5mg  metoprolol at 4:30pm. Doesn't think either of these have effected her HR yet.   I told the patient that her sustained release diltiazem will be unlikely to have significant effect on HR within 2 hours after taking.  It may take several days to build up an effective level in her system.  I discussed options with the patient including taking additional 1 or 2 doses of her metoprolol 12.5 mg or alternatively coming to the emergency department for consideration of cardioversion.  She states that she would strongly like to avoid coming to the ED if at all possible.  Given that she is essentially asymptomatic, I think trying to treat this at home tonight is reasonable.  She will take 1 more dose of her metoprolol at this time and continue to monitor her heart rate and blood pressure.  She will call back or come to the ED if she develops any symptoms of syncope, presyncope, chest pain, chest pressure, shortness of breath, or any other new or concerning symptoms.  In the morning she will reassess her heart rate and blood pressure and will either call back or come to the ED if these are still uncontrolled.  Marcie Mowers, MD Cardiology Fellow, PGY-7

## 2019-12-05 ENCOUNTER — Telehealth: Payer: Self-pay | Admitting: Cardiology

## 2019-12-05 ENCOUNTER — Other Ambulatory Visit (HOSPITAL_COMMUNITY)
Admission: RE | Admit: 2019-12-05 | Discharge: 2019-12-05 | Disposition: A | Discharge status: Home / Self Care | Payer: Medicare Other | Source: Ambulatory Visit | Attending: Cardiovascular Disease | Admitting: Cardiovascular Disease

## 2019-12-05 DIAGNOSIS — Z20822 Contact with and (suspected) exposure to covid-19: Secondary | ICD-10-CM | POA: Insufficient documentation

## 2019-12-05 DIAGNOSIS — Z01812 Encounter for preprocedural laboratory examination: Secondary | ICD-10-CM | POA: Diagnosis present

## 2019-12-05 DIAGNOSIS — Z79899 Other long term (current) drug therapy: Secondary | ICD-10-CM | POA: Diagnosis not present

## 2019-12-05 DIAGNOSIS — I480 Paroxysmal atrial fibrillation: Secondary | ICD-10-CM | POA: Insufficient documentation

## 2019-12-05 DIAGNOSIS — R0683 Snoring: Secondary | ICD-10-CM | POA: Insufficient documentation

## 2019-12-05 LAB — SARS CORONAVIRUS 2 (TAT 6-24 HRS): SARS Coronavirus 2: NEGATIVE

## 2019-12-05 NOTE — Telephone Encounter (Signed)
Pt called in reporting confusion over which medications to be taking. Reviewed recent phone notes of switching from metoprolol to Dilt for her PAF. She reports going to bed in afib, now feels she is back in SR. I instructed to continue with her new Diltiazem dose of 120mg  as she has not felt well on the metoprolol. Instructed to keep the metoprolol for breakthrough afib episodes. She was agreeable to this plan and thanked me for the call back. ER precautions given.

## 2019-12-07 ENCOUNTER — Telehealth: Payer: Self-pay | Admitting: Cardiology

## 2019-12-07 ENCOUNTER — Other Ambulatory Visit: Payer: Self-pay

## 2019-12-07 ENCOUNTER — Encounter (HOSPITAL_COMMUNITY): Payer: Self-pay | Admitting: Physician Assistant

## 2019-12-07 ENCOUNTER — Encounter: Payer: Self-pay | Admitting: Physician Assistant

## 2019-12-07 ENCOUNTER — Telehealth (HOSPITAL_COMMUNITY): Payer: Self-pay | Admitting: Physician Assistant

## 2019-12-07 ENCOUNTER — Ambulatory Visit (HOSPITAL_COMMUNITY)
Admission: RE | Admit: 2019-12-07 | Discharge: 2019-12-07 | Disposition: A | Discharge status: Home / Self Care | Payer: Medicare Other | Source: Ambulatory Visit | Attending: Physician Assistant | Admitting: Physician Assistant

## 2019-12-07 VITALS — BP 106/70 | HR 79 | Ht 63.0 in | Wt 124.8 lb

## 2019-12-07 DIAGNOSIS — I10 Essential (primary) hypertension: Secondary | ICD-10-CM | POA: Insufficient documentation

## 2019-12-07 DIAGNOSIS — R0683 Snoring: Secondary | ICD-10-CM | POA: Diagnosis not present

## 2019-12-07 DIAGNOSIS — Z7901 Long term (current) use of anticoagulants: Secondary | ICD-10-CM | POA: Insufficient documentation

## 2019-12-07 DIAGNOSIS — I480 Paroxysmal atrial fibrillation: Secondary | ICD-10-CM | POA: Diagnosis present

## 2019-12-07 DIAGNOSIS — Z79899 Other long term (current) drug therapy: Secondary | ICD-10-CM | POA: Insufficient documentation

## 2019-12-07 DIAGNOSIS — D6869 Other thrombophilia: Secondary | ICD-10-CM

## 2019-12-07 MED ORDER — FLECAINIDE ACETATE 50 MG PO TABS: 50.0000 mg | ORAL_TABLET | Freq: Two times a day (BID) | ORAL | 6 refills | Status: DC

## 2019-12-07 MED ORDER — METOPROLOL TARTRATE 25 MG PO TABS: 12.5000 mg | ORAL_TABLET | Freq: Two times a day (BID) | ORAL | 3 refills | Status: DC | PRN

## 2019-12-07 MED ORDER — DILTIAZEM HCL ER COATED BEADS 120 MG PO CP24: 120.0000 mg | ORAL_CAPSULE | Freq: Every day | ORAL | 6 refills | Status: DC

## 2019-12-07 NOTE — Telephone Encounter (Signed)
Called and left message for patient to call back.  Per Adline Peals, PA, pt needs appt this wk due to recent medication changes & contacting on-call MD this past weekend.

## 2019-12-07 NOTE — Telephone Encounter (Signed)
Patient c/o Palpitations:  High priority if patient c/o lightheadedness, shortness of breath, or chest pain  1) How long have you had palpitations/irregular HR/ Afib? Are you having the symptoms now?  Started Friday-, Sunday was in and last night- not right- She said she talked to our PA on call Friday night and he started her on Diltiazem  2) Are you currently experiencing lightheadedness, SOB or CP? no  3) Do you have a history of afib (atrial fibrillation) or irregular heart rhythm? Yes history of Afib  4) Have you checked your BP or HR? (document readings if available): 102/72 and hear rate 72  5) Are you experiencing any other symptoms? no
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
## 2019-12-07 NOTE — Telephone Encounter (Signed)
Spoke with patient and she is agreeable to appt at 1:30 pm today with Adline Peals, PA.

## 2019-12-07 NOTE — Telephone Encounter (Signed)
Contacted patient- she states that she called Afib clinic on Thursday- to ask about her Metoprolol dose, she was having some issues and they advised her to stop the Metoprolol for 3 days. She did not take it on Thursday, and not on Friday- but Friday morning she woke up and was in AFIB, she states that she tried to do the 1/2 dose of metoprolol to get herself out of AFIB as instructed but it did not work, she called on call doctor Friday evening since offices were closed, note from PA is in chart, she instructed her to take Diltaizem 120 and see if this helped and gets her out of AFIB, patient then states that Saturday morning she woke up and was out of AFIB- but was unsure of what she should take either Dilt or Metoprolol, so she called back in- spoke to another on call doctor and they advised to do the same thing- to take the [place]. But then patient states on Sunday she woke up and was in AFIB again- so she called back and they advised her to continue the [place]- but to use the Metoprolol if needed to get out of AFIB, so she did- she is out of AFIB as of this morning but she had to take 2 tablets of the Metoprolol. Her question now is what she should do for medications- should she take the Dilt or should she continue her normal dose of the Metoprolol- she states her BP now is 102/72 and I advised her to not take anything else now since she is out of AFIB- and I would call her back with further instructions.  All notes from previous encounters are in chart.  Please advise.  Thank you!

## 2019-12-07 NOTE — Telephone Encounter (Signed)
Per phone noted review - patient is to continue diltiazem 120mg  daily and use metoprolol only for breakthrough episodes (see note from 3/27).  Please schedule f/u with Afib clinic ASAP.

## 2019-12-07 NOTE — Telephone Encounter (Signed)
Clint - if you can handle this - would be great.  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  Glenetta Hew, MD

## 2019-12-07 NOTE — Telephone Encounter (Signed)
Thanks for seeing her.   I figured that she was going to need AAD - but was trying to get away with minimal Rx.   Figured Flecainide was best option.    Thanks.  [place]

## 2019-12-07 NOTE — Telephone Encounter (Signed)
Pt returned my call.  She is aware of appt today at 1:30 with Adline Peals, PA.

## 2019-12-07 NOTE — Patient Instructions (Signed)
Start Flecainide 50mg twice a day 

## 2019-12-07 NOTE — Telephone Encounter (Signed)
Contacted patient- advised of message for medication question.   Can we please schedule for the AFIB clinic ASAP - will route to clinic pool to get patient scheduled. Thank you!

## 2019-12-08 ENCOUNTER — Other Ambulatory Visit: Payer: Self-pay

## 2019-12-08 ENCOUNTER — Ambulatory Visit (HOSPITAL_BASED_OUTPATIENT_CLINIC_OR_DEPARTMENT_OTHER): Payer: Medicare Other | Admitting: Cardiovascular Disease

## 2019-12-08 DIAGNOSIS — I480 Paroxysmal atrial fibrillation: Secondary | ICD-10-CM

## 2019-12-08 DIAGNOSIS — Z01812 Encounter for preprocedural laboratory examination: Secondary | ICD-10-CM | POA: Diagnosis not present

## 2019-12-08 DIAGNOSIS — R0683 Snoring: Secondary | ICD-10-CM | POA: Diagnosis not present

## 2019-12-09 NOTE — Telephone Encounter (Signed)
Great -- thanks  Glenetta Hew, MD

## 2019-12-11 ENCOUNTER — Ambulatory Visit (HOSPITAL_COMMUNITY)
Admission: RE | Admit: 2019-12-11 | Discharge: 2019-12-11 | Disposition: A | Discharge status: Home / Self Care | Payer: Medicare Other | Source: Ambulatory Visit | Attending: Nurse Practitioner | Admitting: Nurse Practitioner

## 2019-12-11 VITALS — BP 150/86 | HR 70

## 2019-12-11 DIAGNOSIS — I480 Paroxysmal atrial fibrillation: Secondary | ICD-10-CM

## 2019-12-11 NOTE — Progress Notes (Signed)
Pt is in for EKG for start of flecainide 50 mg bid for PAF. Ekg shows SR at 70 bpm, pr int 166 ms, qrs 80 ms, qtc 423 ms. She had some dizziness to start with flecainide but that has resolved. She is an avid exerciser so witll scheduled treadmill test to look for interval changes while exercising. She has f/u with Adline Peals in 2 months.

## 2019-12-14 NOTE — Procedures (Signed)
    Patient Name: Nancy Oconnell, Nancy Oconnell Date: 12/08/2019 Gender: Female D.O.B: [birth year] Age (years): 89 Referring Provider: Malka So PA Height (inches): 63 Interpreting Physician: Fransico Him MD, ABSM Weight (lbs): 122 RPSGT: Zadie Rhine BMI: 22 MRN: [id number] Neck Size: 12.00  CLINICAL INFORMATION Sleep Study Type: NPSG  Indication for sleep study: Snoring  Epworth Sleepiness Score: 4  SLEEP STUDY TECHNIQUE As per the AASM Manual for the Scoring of Sleep and Associated Events v2.3 (April 2016) with a hypopnea requiring 4% desaturations.  The channels recorded and monitored were frontal, central and occipital EEG, electrooculogram (EOG), submentalis EMG (chin), nasal and oral airflow, thoracic and abdominal wall motion, anterior tibialis EMG, snore microphone, electrocardiogram, and pulse oximetry.  MEDICATIONS Medications self-administered by patient taken the night of the study : [place] The study was initiated at 10:15:08 PM and ended at 4:32:10 AM.  Sleep onset time was 35.5 minutes and the sleep efficiency was 25.6%. The total sleep time was 96.5 minutes.  Stage REM latency was N/A minutes.  The patient spent 21.8% of the night in stage N1 sleep, 64.8% in stage N2 sleep, 13.5% in stage N3 and 0% in REM.  Alpha intrusion was absent.  Supine sleep was 15.53%.  RESPIRATORY PARAMETERS The overall apnea/hypopnea index (AHI) was 1.2 per hour. There were 0 total apneas, including 0 obstructive, 0 central and 0 mixed apneas. There were 2 hypopneas and 46 RERAs.  The AHI during Stage REM sleep was N/A per hour.  AHI while supine was 8.0 per hour.  The mean oxygen saturation was 94.0%. The minimum SpO2 during sleep was 89.0%.  moderate snoring was noted during this study.  CARDIAC DATA The 2 lead EKG demonstrated sinus rhythm. The mean heart rate was 65.2 beats per minute. Other EKG findings include: None.  LEG MOVEMENT DATA The total  PLMS were 0 with a resulting PLMS index of 0.0. Associated arousal with leg movement index was 3.7 .  IMPRESSIONS - Nondiagnostic study due to inadequate sleep time.  - No significant central sleep apnea occurred during this study (CAI = 0.0/h). - The patient had minimal or no oxygen desaturation during the study (Min O2 = 89.0%) - The patient snored with moderate snoring volume. - No cardiac abnormalities were noted during this study. - Clinically significant periodic limb movements did not occur during sleep. No significant associated arousals.  DIAGNOSIS - Nondiagnostic Study  RECOMMENDATIONS - Avoid alcohol, sedatives and other CNS depressants that may worsen sleep apnea and disrupt normal sleep architecture. - Sleep hygiene should be reviewed to assess factors that may improve sleep quality. - Weight management and regular exercise should be initiated or continued if appropriate. - Recommend Home Sleep Study or repeat in lab study with sleep aide.   [Electronically signed] 12/14/2019 07:05 PM  Fransico Him MD, ABSM Diplomate, American Board of Sleep Medicine

## 2019-12-16 ENCOUNTER — Other Ambulatory Visit (HOSPITAL_COMMUNITY): Payer: Self-pay | Admitting: Physician Assistant

## 2019-12-17 ENCOUNTER — Telehealth: Payer: Self-pay | Admitting: *Deleted

## 2019-12-17 NOTE — Telephone Encounter (Signed)
-----   Message from Sueanne Margarita, MD sent at 12/14/2019  7:09 PM EDT ----- Nondiagnostic sleep study due to inadequate sleep time.  Sleep study needs to be repeated.  Please find out if patient wants to have in lab study with sleep aide or home sleep study

## 2019-12-17 NOTE — Telephone Encounter (Signed)
Informed patient of sleep study results and patient understanding was verbalized. Patient understands her sleep study showed Nondiagnostic sleep study due to inadequate sleep time. Sleep study needs to be repeated. Please find out if patient wants to have in lab study with sleep aide or home sleep study   Pt is aware and agreeable to her results.   Pt would like the Home Sleep Study.  Home sleep Study sent to sleep pool.

## 2019-12-19 ENCOUNTER — Other Ambulatory Visit (HOSPITAL_COMMUNITY)
Admission: RE | Admit: 2019-12-19 | Discharge: 2019-12-19 | Disposition: A | Discharge status: Home / Self Care | Payer: Medicare Other | Source: Ambulatory Visit | Attending: Physician Assistant | Admitting: Physician Assistant

## 2019-12-19 DIAGNOSIS — Z20822 Contact with and (suspected) exposure to covid-19: Secondary | ICD-10-CM | POA: Insufficient documentation

## 2019-12-19 DIAGNOSIS — Z01812 Encounter for preprocedural laboratory examination: Secondary | ICD-10-CM | POA: Insufficient documentation

## 2019-12-19 LAB — SARS CORONAVIRUS 2 (TAT 6-24 HRS): SARS Coronavirus 2: NEGATIVE

## 2019-12-21 ENCOUNTER — Encounter: Payer: Self-pay | Admitting: *Deleted

## 2019-12-21 DIAGNOSIS — R0602 Shortness of breath: Secondary | ICD-10-CM

## 2019-12-21 NOTE — Telephone Encounter (Signed)
This encounter was created in error - please disregard.

## 2019-12-21 NOTE — Telephone Encounter (Signed)
  Freada Bergeron, CMA  Freada Bergeron, CMA         ----- Message -----  From: Freada Bergeron, CMA  Sent: 12/17/2019  5:38 PM EDT  To: [facility]  Subject: precert                      Home Sleep Study

## 2019-12-21 NOTE — Telephone Encounter (Signed)
-----   Message from Freada Bergeron, [place] sent at 12/21/2019  1:28 PM EDT ----- Regarding: FW: precert  ----- Message ----- From: Freada Bergeron, CMA Sent: 12/17/2019   5:38 PM EDT To: [facility] Sleep Studies Subject: precert                                        Home Sleep Study

## 2019-12-21 NOTE — Telephone Encounter (Signed)
Patient is aware and agreeable to Home Sleep Study through [facility]. Patient is scheduled for 03/03/20 at 9:30 to pick up home sleep kit and meet with Respiratory therapist at [facility]. Patient is aware that if this appointment date and time does not work for them they should contact Artis Delay directly at [phone]. Patient is aware that a sleep packet will be sent from [facility] in week. Patient is agreeable to treatment and thankful for call.

## 2019-12-22 ENCOUNTER — Other Ambulatory Visit: Payer: Self-pay

## 2019-12-22 ENCOUNTER — Encounter (HOSPITAL_COMMUNITY): Payer: Self-pay | Admitting: *Deleted

## 2019-12-22 ENCOUNTER — Ambulatory Visit (INDEPENDENT_AMBULATORY_CARE_PROVIDER_SITE_OTHER): Payer: Medicare Other

## 2019-12-22 ENCOUNTER — Other Ambulatory Visit (HOSPITAL_COMMUNITY): Payer: Self-pay | Admitting: Physician Assistant

## 2019-12-22 DIAGNOSIS — Z79899 Other long term (current) drug therapy: Secondary | ICD-10-CM

## 2019-12-22 DIAGNOSIS — I480 Paroxysmal atrial fibrillation: Secondary | ICD-10-CM

## 2019-12-22 DIAGNOSIS — Z5181 Encounter for therapeutic drug level monitoring: Secondary | ICD-10-CM

## 2019-12-22 LAB — EXERCISE TOLERANCE TEST
Estimated workload: 9.9 METS
Exercise duration (min): 6 min
Exercise duration (sec): 59 s
MPHR: 149 {beats}/min
Peak HR: 153 {beats}/min
Percent HR: 102 %
RPE: 15
Rest HR: 82 {beats}/min

## 2019-12-23 ENCOUNTER — Telehealth: Payer: Self-pay | Admitting: Cardiology

## 2019-12-27 ENCOUNTER — Other Ambulatory Visit: Payer: Self-pay | Admitting: Physician Assistant

## 2020-01-01 ENCOUNTER — Other Ambulatory Visit (HOSPITAL_COMMUNITY): Payer: Medicare Other

## 2020-01-04 ENCOUNTER — Other Ambulatory Visit (HOSPITAL_COMMUNITY): Payer: Self-pay | Admitting: *Deleted

## 2020-01-04 MED ORDER — FLECAINIDE ACETATE 50 MG PO TABS: 50.0000 mg | ORAL_TABLET | Freq: Two times a day (BID) | ORAL | 2 refills | Status: DC

## 2020-01-04 MED ORDER — APIXABAN 5 MG PO TABS: 5.0000 mg | ORAL_TABLET | Freq: Two times a day (BID) | ORAL | 2 refills | Status: DC

## 2020-01-04 MED ORDER — DILTIAZEM HCL ER COATED BEADS 120 MG PO CP24: ORAL_CAPSULE | ORAL | 2 refills | Status: DC

## 2020-01-07 ENCOUNTER — Telehealth (HOSPITAL_COMMUNITY): Payer: Self-pay | Admitting: *Deleted

## 2020-01-07 NOTE — Telephone Encounter (Signed)
Whomever is doing the procedure needs to send a request to stop anticoagulation to the office.  We need to know what they are wanting as far as number of days off medication.  Have them fax the request to the main [facility] fax [phone]) and we'll respond to it that way.    Thanks,  Erasmo Downer

## 2020-01-07 NOTE — Telephone Encounter (Signed)
Patient having D&C on 5/21 at [facility] - needing instructions for Eliquis preop and postop.

## 2020-01-07 NOTE — Telephone Encounter (Signed)
Request sent to Dr. Sherrell Puller to provide clearance request for pending surgery.

## 2020-01-08 ENCOUNTER — Encounter (HOSPITAL_COMMUNITY): Payer: Self-pay
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
## 2020-02-03 ENCOUNTER — Ambulatory Visit (HOSPITAL_COMMUNITY)
Admission: RE | Admit: 2020-02-03 | Discharge: 2020-02-03 | Disposition: A | Discharge status: Home / Self Care | Payer: Medicare Other | Source: Ambulatory Visit | Attending: Physician Assistant | Admitting: Physician Assistant

## 2020-02-03 ENCOUNTER — Encounter (HOSPITAL_COMMUNITY): Payer: Self-pay | Admitting: Physician Assistant

## 2020-02-03 ENCOUNTER — Other Ambulatory Visit: Payer: Self-pay

## 2020-02-03 VITALS — BP 130/80 | HR 74 | Ht 63.0 in | Wt 124.8 lb

## 2020-02-03 DIAGNOSIS — R0683 Snoring: Secondary | ICD-10-CM | POA: Insufficient documentation

## 2020-02-03 DIAGNOSIS — Z888 Allergy status to other drugs, medicaments and biological substances status: Secondary | ICD-10-CM | POA: Diagnosis not present

## 2020-02-03 DIAGNOSIS — I480 Paroxysmal atrial fibrillation: Secondary | ICD-10-CM

## 2020-02-03 DIAGNOSIS — Z923 Personal history of irradiation: Secondary | ICD-10-CM | POA: Insufficient documentation

## 2020-02-03 DIAGNOSIS — Z9010 Acquired absence of unspecified breast and nipple: Secondary | ICD-10-CM | POA: Insufficient documentation

## 2020-02-03 DIAGNOSIS — Z8249 Family history of ischemic heart disease and other diseases of the circulatory system: Secondary | ICD-10-CM | POA: Insufficient documentation

## 2020-02-03 DIAGNOSIS — G4761 Periodic limb movement disorder: Secondary | ICD-10-CM | POA: Diagnosis not present

## 2020-02-03 DIAGNOSIS — Z7901 Long term (current) use of anticoagulants: Secondary | ICD-10-CM | POA: Insufficient documentation

## 2020-02-03 DIAGNOSIS — Z853 Personal history of malignant neoplasm of breast: Secondary | ICD-10-CM | POA: Insufficient documentation

## 2020-02-03 DIAGNOSIS — Z79899 Other long term (current) drug therapy: Secondary | ICD-10-CM | POA: Insufficient documentation

## 2020-02-03 DIAGNOSIS — E785 Hyperlipidemia, unspecified: Secondary | ICD-10-CM | POA: Insufficient documentation

## 2020-02-03 DIAGNOSIS — Z91048 Other nonmedicinal substance allergy status: Secondary | ICD-10-CM | POA: Diagnosis not present

## 2020-02-03 DIAGNOSIS — I10 Essential (primary) hypertension: Secondary | ICD-10-CM | POA: Insufficient documentation

## 2020-02-03 DIAGNOSIS — M1990 Unspecified osteoarthritis, unspecified site: Secondary | ICD-10-CM | POA: Diagnosis not present

## 2020-02-03 DIAGNOSIS — D6869 Other thrombophilia: Secondary | ICD-10-CM | POA: Diagnosis not present

## 2020-02-03 DIAGNOSIS — R0681 Apnea, not elsewhere classified: Secondary | ICD-10-CM | POA: Insufficient documentation

## 2020-02-03 MED ORDER — METOPROLOL SUCCINATE ER 25 MG PO TB24: 12.5000 mg | ORAL_TABLET | Freq: Every day | ORAL | 2 refills | Status: DC

## 2020-03-02 ENCOUNTER — Telehealth (HOSPITAL_COMMUNITY): Payer: Self-pay | Admitting: *Deleted

## 2020-03-02 NOTE — Telephone Encounter (Signed)
Patient called in stating she has an upcoming HST and is worried the metoprolol will prevent her from getting the sleep needed. The metoprolol has helped her constipation tremendously but has effected her sleep some. Per Adline Peals PA ok to skip metoprolol night of HST and resume next day. Pt verbalized understanding.

## 2020-03-03 ENCOUNTER — Ambulatory Visit (HOSPITAL_BASED_OUTPATIENT_CLINIC_OR_DEPARTMENT_OTHER): Payer: Medicare Other | Attending: Physician Assistant | Admitting: Cardiovascular Disease

## 2020-03-03 ENCOUNTER — Other Ambulatory Visit: Payer: Self-pay

## 2020-03-03 DIAGNOSIS — R0602 Shortness of breath: Secondary | ICD-10-CM | POA: Diagnosis not present

## 2020-03-03 DIAGNOSIS — Z79899 Other long term (current) drug therapy: Secondary | ICD-10-CM | POA: Diagnosis not present

## 2020-03-03 DIAGNOSIS — G4733 Obstructive sleep apnea (adult) (pediatric): Secondary | ICD-10-CM | POA: Diagnosis not present

## 2020-03-03 DIAGNOSIS — Z7901 Long term (current) use of anticoagulants: Secondary | ICD-10-CM | POA: Diagnosis not present

## 2020-03-09 ENCOUNTER — Encounter (HOSPITAL_BASED_OUTPATIENT_CLINIC_OR_DEPARTMENT_OTHER): Payer: Self-pay | Admitting: Cardiovascular Disease

## 2020-03-09 NOTE — Procedures (Signed)
     Patient Name: Nancy Oconnell, Nancy Oconnell Date: 03/03/2020 Gender: Female D.O.B: [birth year] Age (years): 74 Referring Provider: Malka So PA Height (inches): 63 Interpreting Physician: Shelva Majestic MD, ABSM Weight (lbs): 122 RPSGT: [place], Holly BMI: 22 MRN: [id number] Neck Size: 12.00  CLINICAL INFORMATION Sleep Study Type: HST  Indication for sleep study: N/A  Epworth Sleepiness Score: 2  SLEEP STUDY TECHNIQUE A multi-channel overnight portable sleep study was performed. The channels recorded were: nasal airflow, thoracic respiratory movement, and oxygen saturation with a pulse oximetry. Snoring was also monitored.  MEDICATIONS acetaminophen (TYLENOL) 500 MG tablet apixaban (ELIQUIS) 5 MG TABS tablet Estradiol 10 MCG TABS vaginal tablet flecainide (TAMBOCOR) 50 MG tablet metoprolol succinate (TOPROL XL) 25 MG 24 hr tablet metoprolol tartrate (LOPRESSOR) 25 MG tablet(Expired) mirtazapine (REMERON) 15 MG tablet nitrofurantoin (MACRODANTIN) 50 MG capsule senna-docusate (SENNA S) 8.6-50 MG tablet tamoxifen (NOLVADEX) 20 MG tablet Patient self administered medications include: REMERON.  SLEEP ARCHITECTURE Patient was studied for 340 minutes. The sleep efficiency was 100.0 % and the patient was supine for 37.4%. The arousal index was 0.0 per hour.  RESPIRATORY PARAMETERS The overall AHI was 6.4 per hour, with a central apnea index of 0.5 per hour.  The oxygen nadir was 86% during sleep. Time <89% was 4.8 minutes.  CARDIAC DATA Mean heart rate during sleep was 63.9 bpm.  IMPRESSIONS - Mild obstructive sleep apnea occurred during this study (AHI = 6.4/h). The severity during REM sleep cannot be assessed on this home study. - No significant central sleep apnea occurred during this study (CAI = 0.5/h). - Moderate oxygen desaturation to a nadir of 86%. - No snoring was audible during this study.  DIAGNOSIS - Obstructive Sleep Apnea (327.23 [G47.33  ICD-10])  RECOMMENDATIONS - Therapeutic CPAP titration to determine optimal pressure required to alleviate sleep disordered breathing. - Effort should be made to optimize nasal and oropharyngeal patency. - Alternatives to CPAP therapy such as a customized oral appliance may be considered. - Avoid alcohol, sedatives and other CNS depressants that may worsen sleep apnea and disrupt normal sleep architecture. - Sleep hygiene should be reviewed to assess factors that may improve sleep quality. - Weight management and regular exercise should be initiated or continued. - Patient may benefit from in-lab study  [Electronically signed] 03/09/2020 05:24 PM  Shelva Majestic MD, [facility], ABSM Diplomate, American Board of Sleep Medicine   NPI: [id number] [place] PH: [phone]   FX: [phone] [place]

## 2020-03-10 ENCOUNTER — Other Ambulatory Visit: Payer: Self-pay

## 2020-03-10 ENCOUNTER — Ambulatory Visit (INDEPENDENT_AMBULATORY_CARE_PROVIDER_SITE_OTHER): Payer: Medicare Other | Admitting: Cardiology

## 2020-03-10 ENCOUNTER — Encounter: Payer: Self-pay | Admitting: Cardiology

## 2020-03-10 VITALS — BP 120/78 | HR 80 | Temp 97.2°F | Ht 63.0 in | Wt 128.0 lb

## 2020-03-10 DIAGNOSIS — R030 Elevated blood-pressure reading, without diagnosis of hypertension: Secondary | ICD-10-CM | POA: Diagnosis not present

## 2020-03-10 DIAGNOSIS — I480 Paroxysmal atrial fibrillation: Secondary | ICD-10-CM

## 2020-03-10 DIAGNOSIS — E782 Mixed hyperlipidemia: Secondary | ICD-10-CM | POA: Diagnosis not present

## 2020-03-10 NOTE — Patient Instructions (Signed)
Medication Instructions:  No changes *If you need a refill on your cardiac medications before your next appointment, please call your pharmacy*   Lab Work: Not needed   Testing/Procedures: Not needed   Follow-Up: At [facility], you and your health needs are our priority.  As part of our continuing mission to provide you with exceptional heart care, we have created designated Provider Care Teams.  These Care Teams include your primary Cardiologist (physician) and Advanced Practice Providers (APPs -  Physician Assistants and Nurse Practitioners) who all work together to provide you with the care you need, when you need it.    Your next appointment:   2 month(s)  The format for your next appointment:   Virtual Visit   Provider:   Glenetta Hew, MD   Other Instructions
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
## 2020-03-17 ENCOUNTER — Encounter: Payer: Self-pay | Admitting: Cardiology

## 2020-03-17 NOTE — Assessment & Plan Note (Signed)
She seems to be doing pretty well with flecainide as far as rhythm control.  Unfortunately being on flecainide, she does have to be on an AV nodal agent.  I would like to see how she does over the next month or 2 with the low-dose Toprol.  She will take it earlier in the evening.  Still use as needed breakthrough Lopressor.    Maintain sinus rhythm with flecainide -> for breakthrough episode she would take a total of 200 mg split up in 2 doses whether it is additional 50+ the dose she already had and then that the next dose would be 100 or 100 mg altogether x2 (every 12 hours)

## 2020-03-17 NOTE — Assessment & Plan Note (Signed)
Lipids are reasonable for her low coronary calcium score.  Would like to see LDL lower than 100, but for now until her GI issues are resolved and we stabilize her A. fib meds I would not make any changes.

## 2020-03-17 NOTE — Assessment & Plan Note (Signed)
She does have some labile blood pressures.  They are pretty well controlled today.  I think if she is able to maintain relatively stable pressures I would not add another medication until we stabilize her current meds.

## 2020-03-22 ENCOUNTER — Telehealth: Payer: Self-pay | Admitting: *Deleted

## 2020-03-22 DIAGNOSIS — G4733 Obstructive sleep apnea (adult) (pediatric): Secondary | ICD-10-CM

## 2020-03-22 NOTE — Addendum Note (Signed)
Addended by: Freada Bergeron on: 03/22/2020 06:22 PM   Modules accepted: Orders

## 2020-03-22 NOTE — Telephone Encounter (Signed)
-----   Message from Nancy Sine, MD sent at 03/09/2020  5:32 PM EDT ----- [name] Please notify pt of results and discuss CPAP titration study or alternatives

## 2020-03-22 NOTE — Telephone Encounter (Signed)
Informed patient of sleep study results and patient understanding was verbalized. Patient understands her sleep study showed:   IMPRESSIONS - Mild obstructive sleep apnea occurred during this study (AHI = 6.4/h). The severity during REM sleep cannot be assessed on this home study.  - Moderate oxygen desaturation to a nadir of 86%.  DIAGNOSIS - Obstructive Sleep Apnea (327.23 [G47.33 ICD-10])  RECOMMENDATIONS - Therapeutic CPAP titration to determine optimal pressure required to alleviate sleep disordered breathing.  - Alternatives to CPAP therapy such as a customized oral appliance may be considered.  Pt is aware and agreeable to her results  Titration ordered

## 2020-03-23 ENCOUNTER — Telehealth: Payer: Self-pay | Admitting: *Deleted

## 2020-03-23 NOTE — Telephone Encounter (Signed)
Patient is scheduled for lab study on 04/27/20. Patient understands her sleep study will be done at [facility] sleep lab. Patient understands she will receive a sleep packet in a week or so. Patient understands to call if she does not receive the sleep packet in a timely manner. Patient agrees with treatment and thanked me for call.

## 2020-03-23 NOTE — Telephone Encounter (Signed)
Patient is concerned she will not sleep just like the study in April. She states her sleep is worse since statrting the Afib drugs. Please advise

## 2020-04-20 ENCOUNTER — Other Ambulatory Visit: Payer: Self-pay | Admitting: Pharmacist Clinician (PhC)/ Clinical Pharmacy Specialist

## 2020-04-20 MED ORDER — BISOPROLOL FUMARATE 5 MG PO TABS: 2.5000 mg | ORAL_TABLET | Freq: Every day | ORAL | 3 refills | Status: DC

## 2020-04-27 ENCOUNTER — Ambulatory Visit (HOSPITAL_BASED_OUTPATIENT_CLINIC_OR_DEPARTMENT_OTHER): Payer: Medicare Other | Attending: Cardiovascular Disease | Admitting: Cardiovascular Disease

## 2020-04-27 ENCOUNTER — Other Ambulatory Visit: Payer: Self-pay

## 2020-04-27 DIAGNOSIS — G4733 Obstructive sleep apnea (adult) (pediatric): Secondary | ICD-10-CM | POA: Insufficient documentation

## 2020-05-12 ENCOUNTER — Encounter: Payer: Self-pay | Admitting: Cardiology

## 2020-05-12 ENCOUNTER — Telehealth (INDEPENDENT_AMBULATORY_CARE_PROVIDER_SITE_OTHER): Payer: Medicare Other | Admitting: Cardiology

## 2020-05-12 VITALS — BP 112/71 | HR 59 | Wt 127.0 lb

## 2020-05-12 DIAGNOSIS — G4733 Obstructive sleep apnea (adult) (pediatric): Secondary | ICD-10-CM | POA: Diagnosis not present

## 2020-05-12 DIAGNOSIS — R030 Elevated blood-pressure reading, without diagnosis of hypertension: Secondary | ICD-10-CM | POA: Diagnosis not present

## 2020-05-12 DIAGNOSIS — I480 Paroxysmal atrial fibrillation: Secondary | ICD-10-CM

## 2020-05-12 MED ORDER — FLECAINIDE ACETATE 50 MG PO TABS: 50.0000 mg | ORAL_TABLET | Freq: Two times a day (BID) | ORAL | 3 refills | Status: DC

## 2020-05-12 MED ORDER — APIXABAN 5 MG PO TABS: 5.0000 mg | ORAL_TABLET | Freq: Two times a day (BID) | ORAL | 3 refills | Status: DC

## 2020-05-12 MED ORDER — BISOPROLOL FUMARATE 5 MG PO TABS
2.5000 mg | ORAL_TABLET | Freq: Every day | ORAL | 3 refills | Status: DC
Start: 2020-05-12 — End: 2021-07-12

## 2020-05-12 NOTE — Assessment & Plan Note (Signed)
Awaiting results of sleep study with CPAP titration.  Unfortunately, reports not available on My Chart.  We will send message to sleep medicine team to see there for CPAP titration results can be finalized so she can actually start using CPAP.

## 2020-05-12 NOTE — Assessment & Plan Note (Signed)
She has had labile blood pressures, but have seem to be stable now.  Pretty things sometimes a little low.  The reduced dose of bisoprolol seems to be working.  No change
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
## 2020-05-12 NOTE — Patient Instructions (Signed)
Medication Instructions:  Your physician recommends that you continue on your current medications as directed. Please refer to the Current Medication list given to you today.  *If you need a refill on your cardiac medications before your next appointment, please call your pharmacy*    Follow-Up: At [facility], you and your health needs are our priority.  As part of our continuing mission to provide you with exceptional heart care, we have created designated Provider Care Teams.  These Care Teams include your primary Cardiologist (physician) and Advanced Practice Providers (APPs -  Physician Assistants and Nurse Practitioners) who all work together to provide you with the care you need, when you need it.  We recommend signing up for the patient portal called "MyChart".  Sign up information is provided on this After Visit Summary.  MyChart is used to connect with patients for Virtual Visits (Telemedicine).  Patients are able to view lab/test results, encounter notes, upcoming appointments, etc.  Non-urgent messages can be sent to your provider as well.   To learn more about what you can do with MyChart, go to [email or web address].    Your next appointment:   6 month(s)  The format for your next appointment:   In Person  Provider:   You may see Glenetta Hew, MD or one of the following Advanced Practice Providers on your designated Care Team:    Rosaria Ferries, PA-C  Jory Sims, DNP, ANP  Cadence Jorene Minors    Other Instructions The recall for a November 2021 AFib clinic appointment has been cancelled per Dr. Ellyn Hack

## 2020-05-12 NOTE — Assessment & Plan Note (Signed)
Seems like me finally have a good plan here.  The bisoprolol seems working well in combination with the plan.  No further breakthroughs. No bleeding issues with Eliquis besides some mild hemorrhoidal bleeding.  Plan: Continue current regimen. Again we discussed breakthrough treatment with 2 x 100 mg flecainide plus additional dose of bisoprolol

## 2020-05-16 ENCOUNTER — Encounter (HOSPITAL_BASED_OUTPATIENT_CLINIC_OR_DEPARTMENT_OTHER): Payer: Self-pay | Admitting: Cardiovascular Disease

## 2020-05-16 NOTE — Procedures (Signed)
Patient Name: Nancy Oconnell, Nancy Oconnell Date: 04/27/2020 Gender: Female D.O.B: 07/26/1946 Age (years): 74 Referring Provider: Shelva Majestic MD, ABSM Height (inches): 63 Interpreting Physician: Shelva Majestic MD, ABSM Weight (lbs): 125 RPSGT: Carolin Coy BMI: 22 MRN: [id number] Neck Size: 12.50  CLINICAL INFORMATION The patient is referred for a CPAP titration to treat sleep apnea.  Date of HST: 03/03/2020: AHI 6.4/h; O2 nadir 86%.  SLEEP STUDY TECHNIQUE As per the AASM Manual for the Scoring of Sleep and Associated Events v2.3 (April 2016) with a hypopnea requiring 4% desaturations.  The channels recorded and monitored were frontal, central and occipital EEG, electrooculogram (EOG), submentalis EMG (chin), nasal and oral airflow, thoracic and abdominal wall motion, anterior tibialis EMG, snore microphone, electrocardiogram, and pulse oximetry. Continuous positive airway pressure (CPAP) was initiated at the beginning of the study and titrated to treat sleep-disordered breathing.  MEDICATIONS acetaminophen (TYLENOL) 500 MG tablet apixaban (ELIQUIS) 5 MG TABS tablet bisoprolol (ZEBETA) 5 MG tablet estradiol (ESTRACE) 0.1 MG/GM vaginal cream flecainide (TAMBOCOR) 50 MG tablet linaclotide (LINZESS) 145 MCG CAPS capsule mirtazapine (REMERON) 15 MG tablet nitrofurantoin (MACRODANTIN) 50 MG capsule tamoxifen (NOLVADEX) 20 MG tablet Medications self-administered by patient taken the night of the study : REMERON  TECHNICIAN COMMENTS Comments added by technician: PATIENT WAS ORDERED AS A CPAP TITRATION. Comments added by scorer: N/A  RESPIRATORY PARAMETERS Optimal PAP Pressure (cm): 12 AHI at Optimal Pressure (/hr): 0.0 Overall Minimal O2 (%): 92.0 Supine % at Optimal Pressure (%): 2 Minimal O2 at Optimal Pressure (%): 95.0   SLEEP ARCHITECTURE The study was initiated at 10:45:42 PM and ended at 4:45:04 AM.  Sleep onset time was 11.6 minutes and the sleep efficiency was  51.9%%. The total sleep time was 186.5 minutes.  The patient spent 13.7%% of the night in stage N1 sleep, 74.8%% in stage N2 sleep, 0.0%% in stage N3 and 11.5% in REM.Stage REM latency was 167.5 minutes  Wake after sleep onset was 161.3. Alpha intrusion was absent. Supine sleep was 0.27%.  CARDIAC DATA The 2 lead EKG demonstrated sinus rhythm. The mean heart rate was 56.6 beats per minute. Other EKG findings include: None.  LEG MOVEMENT DATA The total Periodic Limb Movements of Sleep (PLMS) were 0. The PLMS index was 0.0. A PLMS index of <15 is considered normal in adults.  IMPRESSIONS - CPAP was initiated at 6 cm and was titrated to optimal PAP pressure at 12 cm of water; AHI 0, RDI 3.8/h, O2 nadir 95%. - Central sleep apnea was not noted during this titration (CAI = 0.0/h). - Significant oxygen desaturations were not observed during this titration (min O2 = 92.0%). - The patient snored with moderate snoring volume during this titration study. - No significant cardiac abnormalities were observed during this study; rare PAC - Clinically significant periodic limb movements were not noted during this study. Arousals associated with PLMs were significant.  DIAGNOSIS - Obstructive Sleep Apnea (G47.33) - Periodic Limb Movement During Sleep (G47.61)  RECOMMENDATIONS - Recommend an initial trial of CPAP therapy at12 cm H2O with heated humidification.  A small size Resmed Full Face Mask Mirage Quattro mask was used for the titration study. - Efforet should be made to optimize nasal and oropharyngeal patency. - Avoid alcohol, sedatives and other CNS depressants that may worsen sleep apnea and disrupt normal sleep architecture. - Sleep hygiene should be reviewed to assess factors that may improve sleep quality. - Weight management and regular exercise should be initiated or continued. - Recommend a  download in 30 days and sleep clinic evaluation after 4 weeks of therapy   [Electronically  signed] 05/16/2020 04:58 PM  Shelva Majestic MD, [facility], ABSM Diplomate, American Board of Sleep Medicine   NPI: [id number] [place] PH: [phone]   FX: [phone] [place]

## 2020-05-18 NOTE — Telephone Encounter (Signed)
Titration was completed on 04/27/20 without the sleep aide.

## 2020-05-18 NOTE — Telephone Encounter (Signed)
Can prescribe zolpidem 5 mg to take the night of the sleep study which hopefully will allow for improved sleep so that CPAP data can be obtained.

## 2020-05-20 ENCOUNTER — Telehealth: Payer: Self-pay | Admitting: *Deleted

## 2020-05-20 NOTE — Telephone Encounter (Signed)
Left message sleep study had been completed. Orders for CPAP machine has been sent to [facility]. Once they have received benefit information, they will contact her to schedule a set up appointment. Their contact information also left for patient in case she has any questions.

## 2020-07-07 ENCOUNTER — Other Ambulatory Visit: Payer: Self-pay | Admitting: Cardiology

## 2020-07-07 MED ORDER — FLECAINIDE ACETATE 50 MG PO TABS: 50.0000 mg | ORAL_TABLET | Freq: Two times a day (BID) | ORAL | 3 refills | Status: DC

## 2020-07-07 NOTE — Telephone Encounter (Signed)
Rx has been sent to the pharmacy electronically. ° °

## 2020-07-07 NOTE — Telephone Encounter (Signed)
*  STAT* If patient is at the pharmacy, call can be transferred to refill team.   1. Which medications need to be refilled? (please list name of each medication and dose if known) flecainide (TAMBOCOR) 50 MG tablet  2. Which pharmacy/location (including street and city if local pharmacy) is medication to be sent to? [place], [place]  3. Do they need a 30 day or 90 day supply? 90 with refills

## 2020-08-01 ENCOUNTER — Encounter (HOSPITAL_COMMUNITY): Payer: Self-pay

## 2020-08-01 ENCOUNTER — Other Ambulatory Visit: Payer: Self-pay

## 2020-08-01 ENCOUNTER — Emergency Department (HOSPITAL_COMMUNITY): Payer: Medicare Other

## 2020-08-01 ENCOUNTER — Emergency Department (HOSPITAL_COMMUNITY)
Admission: EM | Admit: 2020-08-01 | Discharge: 2020-08-01 | Disposition: A | Discharge status: Home / Self Care | Payer: Medicare Other | Attending: Emergency Medicine | Admitting: Emergency Medicine

## 2020-08-01 DIAGNOSIS — Z7901 Long term (current) use of anticoagulants: Secondary | ICD-10-CM | POA: Diagnosis not present

## 2020-08-01 DIAGNOSIS — S0990XA Unspecified injury of head, initial encounter: Secondary | ICD-10-CM

## 2020-08-01 DIAGNOSIS — W2201XA Walked into wall, initial encounter: Secondary | ICD-10-CM | POA: Diagnosis not present

## 2020-08-01 DIAGNOSIS — S0083XA Contusion of other part of head, initial encounter: Secondary | ICD-10-CM | POA: Diagnosis not present

## 2020-08-01 DIAGNOSIS — Y9239 Other specified sports and athletic area as the place of occurrence of the external cause: Secondary | ICD-10-CM | POA: Diagnosis not present

## 2020-08-01 DIAGNOSIS — Z853 Personal history of malignant neoplasm of breast: Secondary | ICD-10-CM | POA: Diagnosis not present

## 2020-08-01 DIAGNOSIS — Y93B9 Activity, other involving muscle strengthening exercises: Secondary | ICD-10-CM | POA: Diagnosis not present

## 2020-08-01 DIAGNOSIS — I10 Essential (primary) hypertension: Secondary | ICD-10-CM | POA: Diagnosis not present
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
## 2020-08-01 NOTE — ED Triage Notes (Signed)
Patient states she was running at the gym and looked down at her cell phone and hit her head on the wall. Patient is taking Eliquis. Patient denies LOC.

## 2020-08-01 NOTE — ED Notes (Signed)
Assumed care of patient at this time, nad noted, sr up x2, bed locked and low, call bell w/I reach.  Will continue to monitor. ° °

## 2020-08-01 NOTE — Discharge Instructions (Signed)
CT scan did not show any sign of serious injury or bleeding.  There was an incidental finding of a probable meningioma.  These are benign lesions.

## 2020-08-24 NOTE — Telephone Encounter (Signed)
open encounter in error 

## 2020-10-03 ENCOUNTER — Other Ambulatory Visit: Payer: Self-pay

## 2020-10-03 ENCOUNTER — Ambulatory Visit (INDEPENDENT_AMBULATORY_CARE_PROVIDER_SITE_OTHER): Payer: Medicare Other | Admitting: Cardiovascular Disease

## 2020-10-03 VITALS — BP 121/70 | HR 64 | Ht 63.0 in | Wt 132.0 lb

## 2020-10-03 DIAGNOSIS — E669 Obesity, unspecified: Secondary | ICD-10-CM

## 2020-10-03 DIAGNOSIS — I480 Paroxysmal atrial fibrillation: Secondary | ICD-10-CM

## 2020-10-03 DIAGNOSIS — G4733 Obstructive sleep apnea (adult) (pediatric): Secondary | ICD-10-CM

## 2020-10-03 DIAGNOSIS — Z6832 Body mass index (BMI) 32.0-32.9, adult: Secondary | ICD-10-CM

## 2020-10-03 DIAGNOSIS — Z853 Personal history of malignant neoplasm of breast: Secondary | ICD-10-CM

## 2020-10-03 DIAGNOSIS — E66811 Obesity, class 1: Secondary | ICD-10-CM

## 2020-10-03 DIAGNOSIS — Z7901 Long term (current) use of anticoagulants: Secondary | ICD-10-CM | POA: Diagnosis not present

## 2020-10-03 DIAGNOSIS — D329 Benign neoplasm of meninges, unspecified: Secondary | ICD-10-CM
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
## 2020-10-03 NOTE — Patient Instructions (Signed)

## 2020-10-09 ENCOUNTER — Encounter: Payer: Self-pay | Admitting: Cardiovascular Disease

## 2020-11-12 NOTE — Telephone Encounter (Signed)
Looks like you are having a breakthrough episode of atrial fibrillation.  When you feel these episodes what I want you to do is take an extra 2 tablets of the flecainide.  Hopefully that will potentially break the rhythm.  If it does not, increase your flecainide dose to 100 mg (2 tablets) twice a day until it breaks.  If you are symptomatic with these episodes, we can set up potential cardioversion if it does not break with flecainide.   Glenetta Hew, MD   ===View-only below this line===   ----- Message -----    From: Quincy Simmonds    Sent: 11/11/2020  4:56 PM EST      To: Glenetta Hew, MD Subject: A Fib ECG  See you Mar 9th

## 2020-11-16 ENCOUNTER — Other Ambulatory Visit: Payer: Self-pay

## 2020-11-16 ENCOUNTER — Ambulatory Visit (INDEPENDENT_AMBULATORY_CARE_PROVIDER_SITE_OTHER): Payer: Medicare Other | Admitting: Cardiology

## 2020-11-16 ENCOUNTER — Encounter: Payer: Self-pay | Admitting: Cardiology

## 2020-11-16 VITALS — BP 124/74 | HR 63 | Ht 63.0 in | Wt 129.0 lb

## 2020-11-16 DIAGNOSIS — G4733 Obstructive sleep apnea (adult) (pediatric): Secondary | ICD-10-CM | POA: Diagnosis not present

## 2020-11-16 DIAGNOSIS — E782 Mixed hyperlipidemia: Secondary | ICD-10-CM

## 2020-11-16 DIAGNOSIS — I480 Paroxysmal atrial fibrillation: Secondary | ICD-10-CM | POA: Diagnosis not present

## 2020-11-16 DIAGNOSIS — R030 Elevated blood-pressure reading, without diagnosis of hypertension: Secondary | ICD-10-CM

## 2020-11-16 DIAGNOSIS — Z79899 Other long term (current) drug therapy: Secondary | ICD-10-CM | POA: Insufficient documentation

## 2020-11-16 DIAGNOSIS — Z7901 Long term (current) use of anticoagulants: Secondary | ICD-10-CM | POA: Insufficient documentation

## 2020-11-16 MED ORDER — METOPROLOL TARTRATE 25 MG PO TABS: ORAL_TABLET | ORAL | 6 refills | Status: AC

## 2020-11-16 MED ORDER — FLECAINIDE ACETATE 50 MG PO TABS: 50.0000 mg | ORAL_TABLET | Freq: Two times a day (BID) | ORAL | 3 refills | Status: DC

## 2020-11-16 NOTE — Patient Instructions (Addendum)
Medication Instructions:   For the next month  ( March 9 through April 9,2022) start taking night time dose increase to 100 mg Flecainide , continue with 50 mg in the morning After the one month go back to Flecainide 50 mg twice a day ( 12 hours apart)    If you have break through with Afib Direction are to 1/2 tablet of  25 mg Metoprolol tartrate if after a1/2 hour your still in Afib take an additional dose of 1/2 tablet of 25 mg Metoprolol  Along with 50 mg Flecainide.   If you continue to have frequent breakthrough of Afib. Contact the office . Dr Ellyn Hack  wil make a Referral for you to see Dr Curt Bears or Dr Quentin Ore- ( Electrophysiologist cardiolgist)  *If you need a refill on your cardiac medications before your next appointment, please call your pharmacy*   Lab Work:  Not needed   Testing/Procedures:  Not needed  Follow-Up: At [facility], you and your health needs are our priority.  As part of our continuing mission to provide you with exceptional heart care, we have created designated Provider Care Teams.  These Care Teams include your primary Cardiologist (physician) and Advanced Practice Providers (APPs -  Physician Assistants and Nurse Practitioners) who all work together to provide you with the care you need, when you need it.     Your next appointment:   6 month(s)  The format for your next appointment:   In Person  Provider:   Glenetta Hew, MD   Other Instructions

## 2020-11-16 NOTE — Assessment & Plan Note (Addendum)
PAF with well documented short-lived episodes --> On Eliquis & tolerating well.  This patients CHA2DS2-VASc Score and unadjusted Ischemic Stroke Rate (% per year) is equal to 3.2 % stroke rate/year from a score of 3  Above score calculated as 1 point each if present [CHF, HTN, DM, Vascular=MI/PAD/Aortic Plaque, Age if 65-74, or Female] Above score calculated as 2 points each if present [Age > 75, or Stroke/TIA/TE]    OK to hold 2-3 days pre-op for procedures (3 d for Neuro/spinal procedures & 2 d for all others)

## 2020-11-16 NOTE — Assessment & Plan Note (Signed)
Labile BP - Ok to use PRN Metoprolol for breakthrough.  However, BP looks well controlled on Bisoprolol & tolerating well.
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
## 2020-11-16 NOTE — Assessment & Plan Note (Signed)
Started CPAP in November - seems to be tolerating well   Hopefully, this will reduce risk of Afib recurrence.

## 2020-11-16 NOTE — Assessment & Plan Note (Signed)
She should be due for having labs checked by PCP --  Trying to avoid medications.  Continue dietary modifications & exercise.

## 2020-11-16 NOTE — Assessment & Plan Note (Addendum)
Things been doing very well on her current regimen until her for recent spells.  They are still pretty minimal.  She is a little symptomatic, but these are really short-lived.  She is doing well with with no issues on Eliquis and bisoprolol.  2-3 of 4 breakthrough Afib spells related to EtOH - even in small amount.  Counseled her that it would probably be best to fully abstain.   Plan is as follows: For the next month  ( March 9 through April 9,2022) start taking night time dose increase to 100 mg Flecainide , continue with 50 mg in the morning After the one month go back to Flecainide 50 mg twice a day ( 12 hours apart)    If you have break through with Afib Direction are to 1/2 tablet of  25 mg Metoprolol tartrate if after a1/2 hour your still in Afib take an additional dose of 1/2 tablet of 25 mg Metoprolol  Along with 50 mg Flecainide.   If she continue to have frequent breakthrough of Afib that are very symptomatic and worrisome, she can contact the office and we will wil make a Referral for you to see Dr Curt Bears or Dr Quentin Ore- ( Electrophysiologist Cardiolgists)

## 2020-12-02 MED ORDER — FLECAINIDE ACETATE 50 MG PO TABS: ORAL_TABLET | ORAL | 3 refills | Status: DC

## 2020-12-05 ENCOUNTER — Other Ambulatory Visit: Payer: Self-pay

## 2020-12-05 MED ORDER — FLECAINIDE ACETATE 50 MG PO TABS: ORAL_TABLET | ORAL | 3 refills | Status: DC

## 2021-02-18 IMAGING — CT CT CARDIAC CORONARY ARTERY CALCIUM SCORE
3 series · 14 of 20 positions shown, 15 images · non-contrast
Comparison: None.
COMPARISON: None.

Addendum:
EXAM:
OVER-READ INTERPRETATION  CT CHEST

The following report is an over-read performed by radiologist Dr.
Nunuy Ijaz [REDACTED] on 11/11/2019. This over-read
does not include interpretation of cardiac or coronary anatomy or
pathology. The coronary calcium score interpretation by the
cardiologist is attached.
CLINICAL DATA: 73F with shortness of breath for risk stratification
Coronary Calcium Score
TECHNIQUE: The patient was scanned on a Siemens Force scanner. Axial
non-contrast 3 mm slices were carried out through the heart. The
data set was analyzed on a dedicated work station and scored using
the Agatson method.

[Series 2: casc 3.0 bv41 2 bestsyst 37 % · axial · 0.29mm/px · z∈[-246,-165]mm · 4 of 45 slices shown, 5 images]
[im 9/45  vessel]
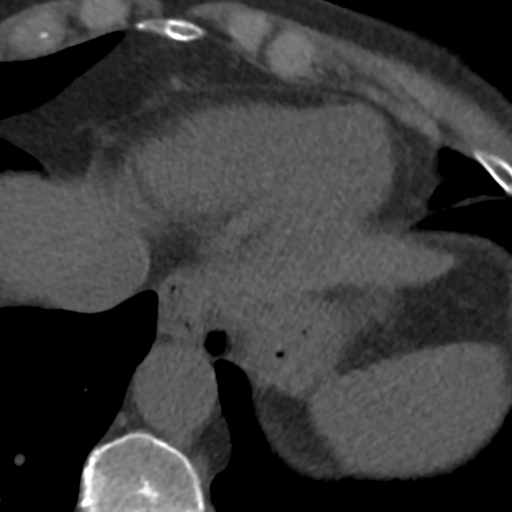
[im 9/45  lung]
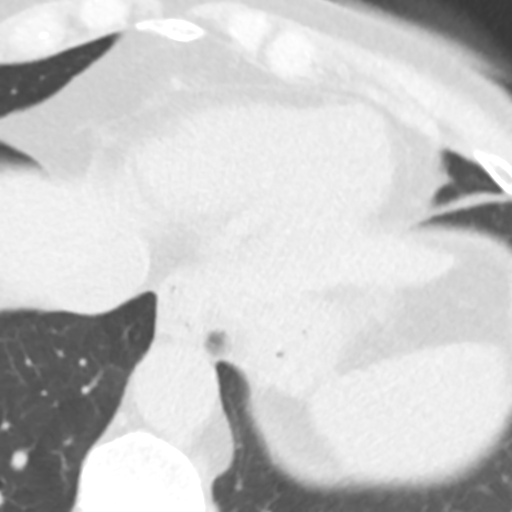
[im 18/45  vessel]
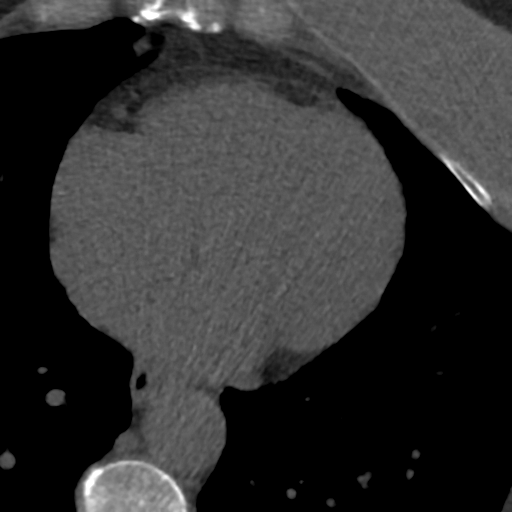
[im 27/45  vessel]
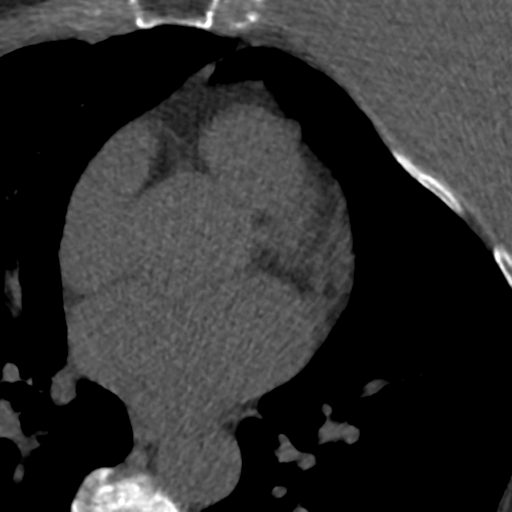
[im 36/45  vessel]
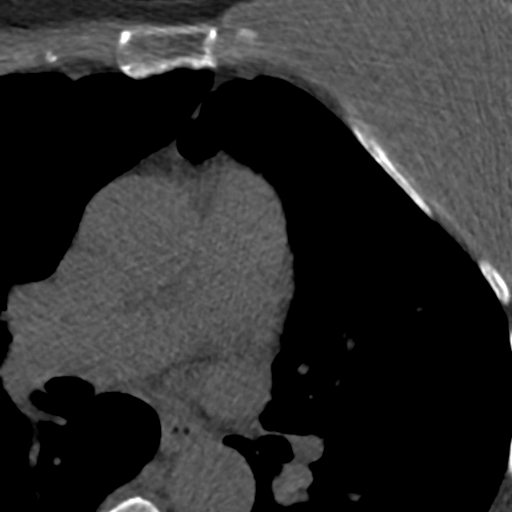

[Series 3: lung 37 % · axial · 0.60mm/px · z∈[-249,-162]mm · 5 of 45 slices shown]
[im 8/45  lung]
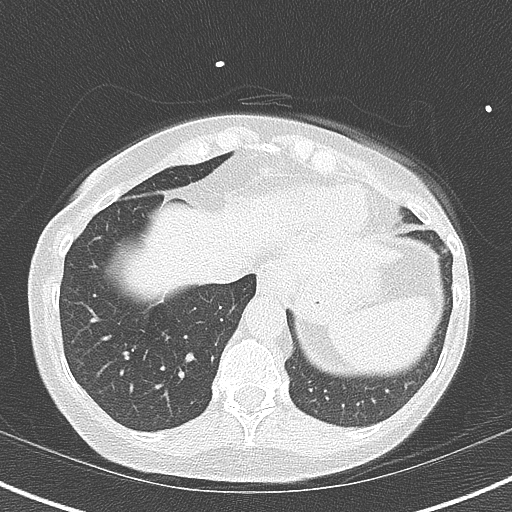
[im 15/45  lung]
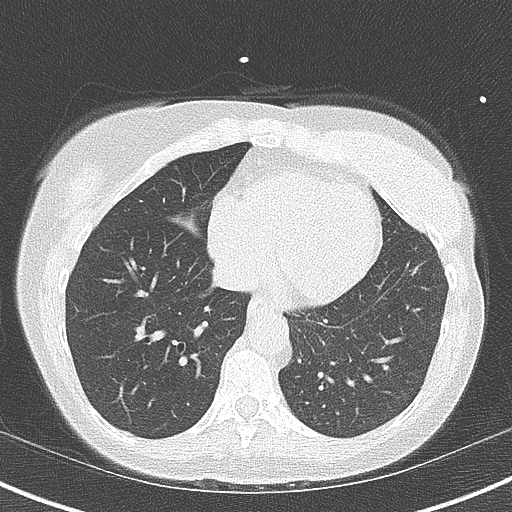
[im 23/45  lung]
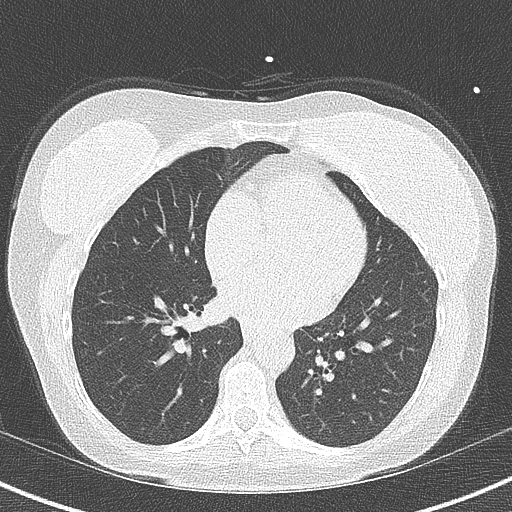
[im 30/45  lung]
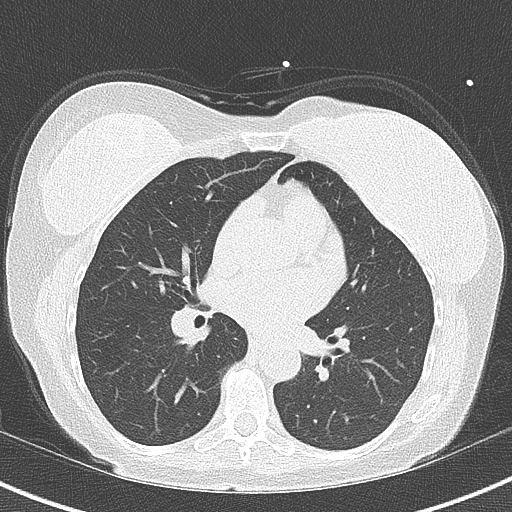
[im 37/45  lung]
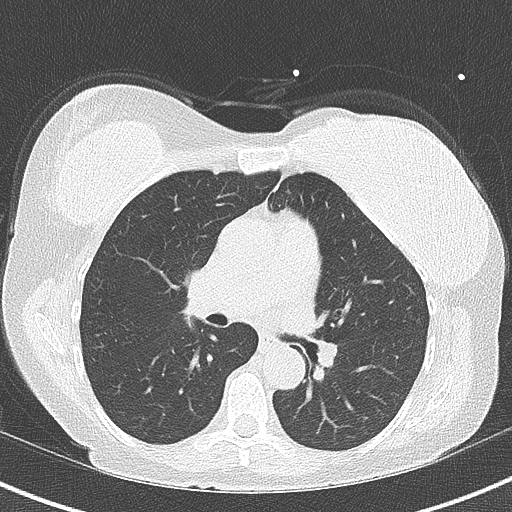

[Series 4: lung st 37 % · axial · 0.60mm/px · z∈[-249,-162]mm · 5 of 45 slices shown]
[im 8/45  lung]
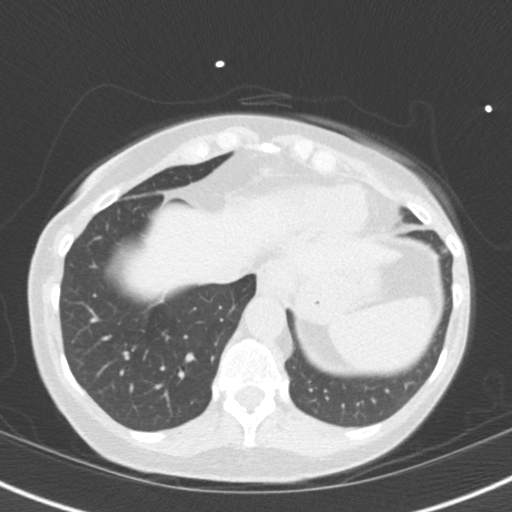
[im 15/45  lung]
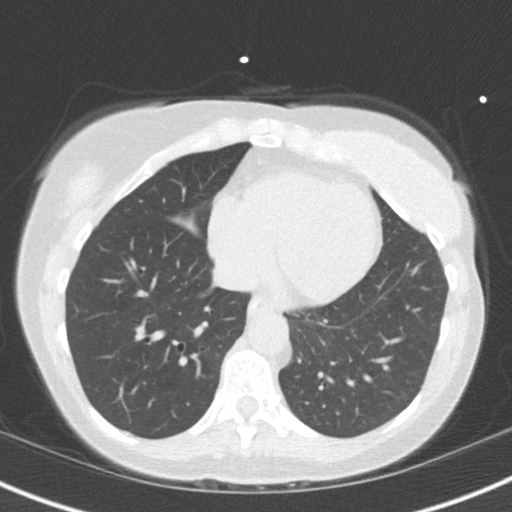
[im 23/45  lung]
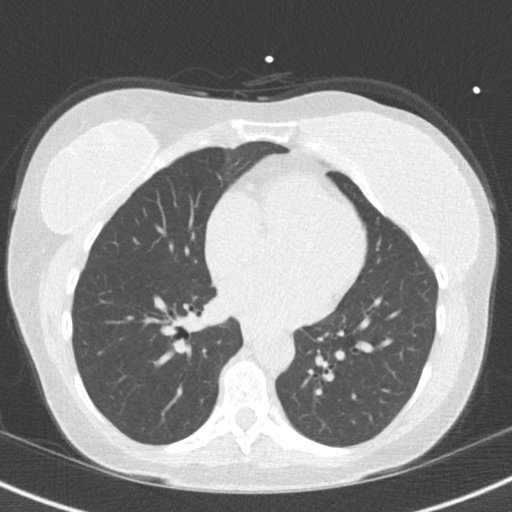
[im 30/45  lung]
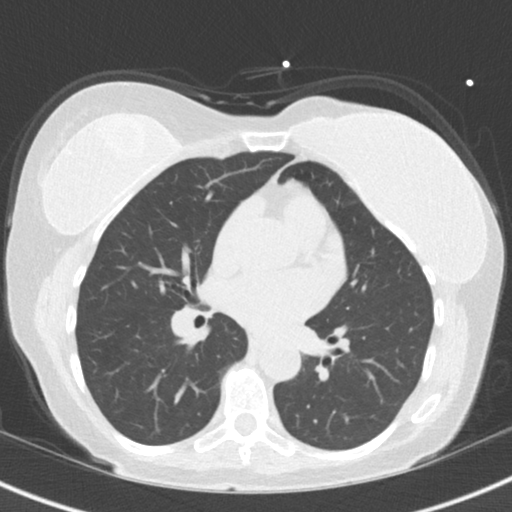
[im 37/45  lung]
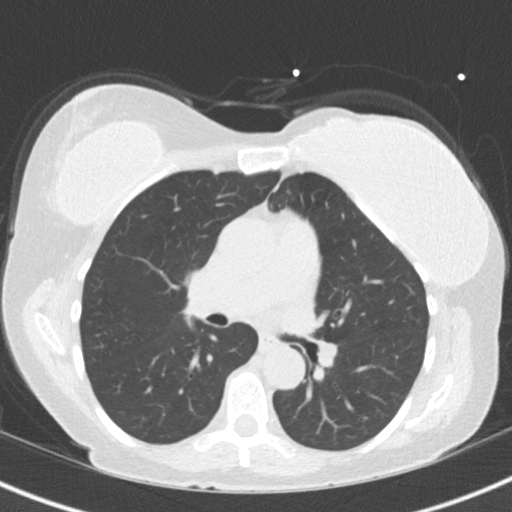

[14 of 20 positions shown; findings below may reference images not displayed]

FINDINGS: Vascular: Heart is normal size.  Aorta is normal caliber.

Mediastinum/Nodes: No adenopathy in the lower mediastinum or hila.

Lungs/Pleura: No confluent opacities or effusions.

Upper Abdomen: Imaging into the upper abdomen shows no acute
findings.

Musculoskeletal: Bilateral breast implants noted. No acute bony
abnormality.
IMPRESSION: No acute or significant extracardiac abnormality.
FINDINGS: Non-cardiac: See separate report from [REDACTED].

Ascending Aorta: Normal size.  3.3 cm.

Pericardium: Normal

Coronary arteries: Normal origins.  No coronary calcium.
IMPRESSION: Coronary calcium score of 0. This was 0 percentile for age and sex
matched control.

*** End of Addendum ***
EXAM:
OVER-READ INTERPRETATION  CT CHEST

The following report is an over-read performed by radiologist Dr.
Nunuy Ijaz [REDACTED] on 11/11/2019. This over-read
does not include interpretation of cardiac or coronary anatomy or
pathology. The coronary calcium score interpretation by the
cardiologist is attached.
FINDINGS: Vascular: Heart is normal size.  Aorta is normal caliber.

Mediastinum/Nodes: No adenopathy in the lower mediastinum or hila.

Lungs/Pleura: No confluent opacities or effusions.

Upper Abdomen: Imaging into the upper abdomen shows no acute
findings.

Musculoskeletal: Bilateral breast implants noted. No acute bony
abnormality.
IMPRESSION: No acute or significant extracardiac abnormality.

## 2021-03-10 ENCOUNTER — Other Ambulatory Visit (HOSPITAL_COMMUNITY): Payer: Self-pay | Admitting: Physician Assistant

## 2021-05-03 HISTORY — PX: TRANSTHORACIC ECHOCARDIOGRAM: SHX275

## 2021-05-04 ENCOUNTER — Telehealth: Payer: Self-pay | Admitting: Cardiology

## 2021-05-04 NOTE — Telephone Encounter (Signed)
    Pt is requesting to speak with Ivin Booty about her on going Afib, but she she is not in [place], she's at [place] and would like to talk to her today

## 2021-05-04 NOTE — Telephone Encounter (Signed)
Returned call to patient of Dr. Ellyn Hack. Explained that message was sent to [facility] regarding her records fax.   She went to hospital Tuesday - Wednesday for AFib - she went b/c she almost blacked out, was cold/shivering, SBP was 150s. She said she was out of AFib on hospital discharge but is now back in AFib. She reports her only symptoms are feeling fluttery. She has an AppleWatch and can run an EKG strip if needed. She reports her highest HR was 112bpm this afternoon. Her highest BP is 124/86. She has taken PRN metoprolol tartrate '25mg'$  this afternoon.   She said it took 9 hours to get her out of AFib at the hospital w/IV meds.   Her flecainide has been increased to '100mg'$  BID - she started last night  She has an appointment in Sept - she is aware she may need an EP referral  Advised will send a message to MD/RN to review

## 2021-05-04 NOTE — Telephone Encounter (Signed)
Dr Ellyn Hack made recommendation . RN sent  information to Patient at 6:48 pm  via MyChart.  Per Dr Ellyn Hack, I read the cardiology consult note from [facility].  Recommendations made since.  Her echocardiogram looked fine.  I do agree with her increasing her dose of flecainide 100 mg twice daily.  For her breakthrough spells she should take an additional 100 mg flecainide plus a PRN dose of metoprolol.  It does appear that her blood pressure seems to be stable and her heart rates are not at high as they were in the hospital.  Hopefully she will spontaneously convert out of this current episode.   If she is still having breakthrough episodes on flecainide when I see her in September, my plan will be to consider EP referral to potentially convert to a different antiarrhythmic agent or consider the possibility of ablation.

## 2021-05-04 NOTE — Telephone Encounter (Signed)
I read the cardiology consult note from [facility].  Recommendations made since.  Her echocardiogram looked fine.  I do agree with her increasing her dose of flecainide 100 mg twice daily.  For her breakthrough spells she should take an additional 100 mg flecainide plus a PRN dose of metoprolol.  It does appear that her blood pressure seems to be stable and her heart rates are not at high as they were in the hospital.  Hopefully she will spontaneously convert out of this current episode.  If she is still having breakthrough episodes on flecainide when I see her in September, my plan will be to consider EP referral to potentially convert to a different antiarrhythmic agent or consider the possibility of ablation.  Glenetta Hew, MD

## 2021-05-05 NOTE — Telephone Encounter (Signed)
Note sent to patient in MyChart by Michele Rockers

## 2021-05-19 ENCOUNTER — Ambulatory Visit (INDEPENDENT_AMBULATORY_CARE_PROVIDER_SITE_OTHER): Payer: Medicare Other | Admitting: Cardiology

## 2021-05-19 ENCOUNTER — Other Ambulatory Visit: Payer: Self-pay

## 2021-05-19 ENCOUNTER — Encounter: Payer: Self-pay | Admitting: Cardiology

## 2021-05-19 VITALS — BP 122/78 | HR 62 | Ht 64.0 in | Wt 132.4 lb

## 2021-05-19 DIAGNOSIS — I480 Paroxysmal atrial fibrillation: Secondary | ICD-10-CM

## 2021-05-19 DIAGNOSIS — Z7901 Long term (current) use of anticoagulants: Secondary | ICD-10-CM

## 2021-05-19 DIAGNOSIS — E782 Mixed hyperlipidemia: Secondary | ICD-10-CM | POA: Diagnosis not present

## 2021-05-19 DIAGNOSIS — R030 Elevated blood-pressure reading, without diagnosis of hypertension: Secondary | ICD-10-CM
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
## 2021-05-19 NOTE — Patient Instructions (Addendum)
Medication Instructions:  Continue  with current medication And taking Flecainide 100 mg twice a day   *If you need a refill on your cardiac medications before your next appointment, please call your pharmacy*   Lab Work:  Not needed   Testing/Procedures: Not needed   Follow-Up: At [facility], you and your health needs are our priority.  As part of our continuing mission to provide you with exceptional heart care, we have created designated Provider Care Teams.  These Care Teams include your primary Cardiologist (physician) and Advanced Practice Providers (APPs -  Physician Assistants and Nurse Practitioners) who all work together to provide you with the care you need, when you need it.     Your next appointment:   4 to 5 month(s) jan or Feb 2023   The format for your next appointment:   In Person  Provider:   Glenetta Hew, MD   Other Instructions   You have been referred to  Electrophysiologist Dr Curt Bears or  Quentin Ore- discuss atrial  fib ablation

## 2021-05-21 ENCOUNTER — Encounter: Payer: Self-pay | Admitting: Cardiology

## 2021-05-21 NOTE — Assessment & Plan Note (Signed)
Blood pressure been somewhat labile.  Pretty well controlled overall though.  She is on bisoprolol with PRN metoprolol which can be used for breakthrough A. fib or hypertension.

## 2021-05-21 NOTE — Assessment & Plan Note (Signed)
Significant recurrence of A. fib with multiple episodes since her last visit, 1 requiring hospitalization.  She seems to be getting more symptomatic and these episodes seem to be getting a longer period  She did notice initial improvement when we increase the dose back in March, and is now 100 mg BID flecainide.  Unfortunately, she does not feel good on the flecainide at the increased dose of 100 mg.  She is doing fine on Eliquis and rates for the most part has been controlled with bisoprolol.  Based on the amount of A. fib episodes and how she is becoming more symptomatic, I do think it is reasonable to suggest referral to electrophysiology to consider the possibility of A. fib ablation.  Plan: Continue meds as prescribed, and refer to EP for discussion of A. fib ablation.Nancy Oconnell

## 2021-05-21 NOTE — Assessment & Plan Note (Signed)
Recent labs show increasing LDL.  She really would like to get the A. fib taken care of first.  She is can try to adjust her diet, and if still not controlled once we get the A. fib ablation scheduled and completed, we can then consider treatment options.  She really like to avoid adding medications.

## 2021-05-21 NOTE — Assessment & Plan Note (Signed)
Doing well on apixaban.

## 2021-05-26 ENCOUNTER — Other Ambulatory Visit: Payer: Self-pay | Admitting: Cardiology

## 2021-05-26 NOTE — Telephone Encounter (Signed)
Prescription refill request for Eliquis received. Indication:atrial fib Last office visit:9/22 [id number] labs Age: 75 Weight:60.1 kg  Prescription refilled

## 2021-06-02 NOTE — Telephone Encounter (Signed)
Glad to hear that you have not had any further episodes.  Yes, if you have additional episode take the additional metoprolol and the additional flecainide would be 100 mg.  Glenetta Hew, MD

## 2021-06-21 ENCOUNTER — Other Ambulatory Visit: Payer: Self-pay | Admitting: Cardiology

## 2021-06-21 NOTE — Telephone Encounter (Signed)
Prescription refill request for Eliquis received. Indication:Afib Last office visit:9/22 [id number] labs Age: 75 Weight:60.1 kg  Prescription refilled

## 2021-06-23 ENCOUNTER — Other Ambulatory Visit: Payer: Self-pay

## 2021-06-23 MED ORDER — APIXABAN 5 MG PO TABS: ORAL_TABLET | ORAL | 5 refills | Status: DC

## 2021-07-11 ENCOUNTER — Encounter: Payer: Self-pay | Admitting: Cardiology

## 2021-07-11 ENCOUNTER — Other Ambulatory Visit: Payer: Self-pay

## 2021-07-11 ENCOUNTER — Ambulatory Visit (INDEPENDENT_AMBULATORY_CARE_PROVIDER_SITE_OTHER): Payer: Medicare Other | Admitting: Cardiology

## 2021-07-11 VITALS — BP 118/68 | HR 61 | Resp 18 | Ht 63.0 in | Wt 128.8 lb

## 2021-07-11 DIAGNOSIS — I4819 Other persistent atrial fibrillation: Secondary | ICD-10-CM | POA: Diagnosis not present

## 2021-07-11 DIAGNOSIS — I480 Paroxysmal atrial fibrillation: Secondary | ICD-10-CM | POA: Diagnosis not present
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
## 2021-07-11 NOTE — Patient Instructions (Signed)
Medication Instructions:  Your physician recommends that you continue on your current medications as directed. Please refer to the Current Medication list given to you today.  *If you need a refill on your cardiac medications before your next appointment, please call your pharmacy*   Lab Work: Pre procedure labs on 09/05/2021 :  BMP & CBC You do NOT need to be fasting.  If you have labs (blood work) drawn today and your tests are completely normal, you will receive your results only by: [place] (if you have MyChart) OR A paper copy in the mail If you have any lab test that is abnormal or we need to change your treatment, we will call you to review the results.   Testing/Procedures: Your physician has requested that you have cardiac CT within 7 days PRIOR to your ablation. Cardiac computed tomography (CT) is a painless test that uses an x-ray machine to take clear, detailed pictures of your heart.  Please follow instruction below located under "other instructions". You will get a call from our office to schedule the date for this test.  Your physician has recommended that you have an ablation. Catheter ablation is a medical procedure used to treat some cardiac arrhythmias (irregular heartbeats). During catheter ablation, a long, thin, flexible tube is put into a blood vessel in your groin (upper thigh), or neck. This tube is called an ablation catheter. It is then guided to your heart through the blood vessel. Radio frequency waves destroy small areas of heart tissue where abnormal heartbeats may cause an arrhythmia to start. Please follow instruction below located under "other instructions".   Follow-Up: At [facility], you and your health needs are our priority.  As part of our continuing mission to provide you with exceptional heart care, we have created designated Provider Care Teams.  These Care Teams include your primary Cardiologist (physician) and Advanced Practice Providers  (APPs -  Physician Assistants and Nurse Practitioners) who all work together to provide you with the care you need, when you need it.   Your next appointment:   1 month(s) after your ablation  The format for your next appointment:   In Person  Provider:   AFib clinic   Thank you for choosing [facility]!!   Trinidad Curet, RN [phone]    Other Instructions   CT INSTRUCTIONS CT INSTRUCTIONS Your cardiac CT will be scheduled at:  [facility] [address] [place], [place] [address] [phone]  Please arrive at the [facility] main entrance of [facility] 30 minutes prior to test start time. Proceed to the [facility] Radiology Department (first floor) to check-in and test prep.   Please follow these instructions carefully (unless otherwise directed):  On the Night Before the Test: Be sure to Drink plenty of water. Do not consume any caffeinated/decaffeinated beverages or chocolate 12 hours prior to your test. Do not take any antihistamines 12 hours prior to your test.  On the Day of the Test: Drink plenty of water until 1 hour prior to the test. Do not eat any food 4 hours prior to the test. You may take your regular medications prior to the test.  HOLD Furosemide/Hydrochlorothiazide morning of the test. FEMALES- please wear underwire-free bra if available       After the Test: Drink plenty of water. After receiving IV contrast, you may experience a mild flushed feeling. This is normal. On occasion, you may experience a mild rash up to 24 hours  after the test. This is not dangerous. If this occurs, you can take Benadryl 25 mg and increase your fluid intake. If you experience trouble breathing, this can be serious. If it is severe call 911 IMMEDIATELY. If it is mild, please call our office. If you take any of these medications: Glipizide/Metformin, Avandament, Glucavance, please do not take 48 hours after completing test unless  otherwise instructed.   Once we have confirmed authorization from your insurance company, we will call you to set up a date and time for your test. Based on how quickly your insurance processes prior authorizations requests, please allow up to 4 weeks to be contacted for scheduling your Cardiac CT appointment. Be advised that routine Cardiac CT appointments could be scheduled as many as 8 weeks after your provider has ordered it.  For non-scheduling related questions, please contact the cardiac imaging nurse navigator should you have any questions/concerns: Marchia Bond, Cardiac Imaging Nurse Navigator Gordy Clement, Cardiac Imaging Nurse Navigator  Heart and Vascular Services Direct Office Dial: [phone]   For scheduling needs, including cancellations and rescheduling, please call [place], [phone].      Electrophysiology/Ablation Procedure Instructions   You are scheduled for a(n)  ablation on 09/19/2021 with Dr. Allegra Lai.   1.   Pre procedure testing-             A.  LAB WORK --- On 09/05/2021 for your pre procedure blood work.  You do NOT need to be fasting.  You can stop by the [employer] office between 7:30 am - 4:30 pm     On the day of your procedure 09/19/2021 you will go to [facility] hospital ([address]) at 5:30 am.  Dennis Bast will go to the main entrance A [employer]) and enter where the [employer] are.  Your driver will drop you off and you will head down the hallway to ADMITTING.  You may have one support person come in to the hospital with you.  They will be asked to wait in the waiting room. It is OK to have someone drop you off and come back when you are ready to be discharged.   3.   Do not eat or drink after midnight prior to your procedure.   4.   On the morning of your procedure do NOT take any medication. Do not miss any doses of your blood thinner prior to the morning of your procedure or your procedure will need to be  rescheduled.   5.  Plan for an overnight stay but you may be discharged after your procedure, if you use your phone frequently bring your phone charger. If you are discharged after your procedure you will need someone to drive you home and be with you for 24 hours after your procedure.   6. You will follow up with the AFIB clinic 4 weeks after your procedure.  You will follow up with Dr. Curt Bears  3 months after your procedure.  These appointments will be made for you.   7. FYI: For your safety, and to allow [place] to monitor your vital signs accurately during the surgery/procedure we request that if you have artificial nails, gel coating, SNS etc. Please have those removed prior to your surgery/procedure. Not having the nail coverings /polish removed may result in cancellation or delay of your surgery/procedure.  * If you have ANY questions please call the office [phone] and ask for Rohn Fritsch RN or send me a MyChart  message   * Occasionally, EP Studies and ablations can become lengthy.  Please make your family aware of this before your procedure starts.  Average time ranges from 2-8 hours for EP studies/ablations.  Your physician will call your family after the procedure with the results.                                    Cardiac Ablation Cardiac ablation is a procedure to destroy (ablate) some heart tissue that is sending bad signals. These bad signals cause problems in heart rhythm. The heart has many areas that make these signals. If there are problems in these areas, they can make the heart beat in a way that is not normal. Destroying some tissues can help make the heart rhythm normal. Tell your doctor about: Any allergies you have. All medicines you are taking. These include vitamins, herbs, eye drops, creams, and over-the-counter medicines. Any problems you or family members have had with medicines that make you fall asleep (anesthetics). Any blood disorders you have. Any surgeries you  have had. Any medical conditions you have, such as kidney failure. Whether you are pregnant or may be pregnant. What are the risks? This is a safe procedure. But problems may occur, including: Infection. Bruising and bleeding. Bleeding into the chest. Stroke or blood clots. Damage to nearby areas of your body. Allergies to medicines or dyes. The need for a pacemaker if the normal system is damaged. Failure of the procedure to treat the problem. What happens before the procedure? Medicines Ask your doctor about: Changing or stopping your normal medicines. This is important. Taking aspirin and ibuprofen. Do not take these medicines unless your doctor tells you to take them. Taking other medicines, vitamins, herbs, and supplements. General instructions Follow instructions from your doctor about what you cannot eat or drink. Plan to have someone take you home from the hospital or clinic. If you will be going home right after the procedure, plan to have someone with you for 24 hours. Ask your doctor what steps will be taken to prevent infection. What happens during the procedure?  An IV tube will be put into one of your veins. You will be given a medicine to help you relax. The skin on your neck or groin will be numbed. A cut (incision) will be made in your neck or groin. A needle will be put through your cut and into a large vein. A tube (catheter) will be put into the needle. The tube will be moved to your heart. Dye may be put through the tube. This helps your doctor see your heart. Small devices (electrodes) on the tube will send out signals. A type of energy will be used to destroy some heart tissue. The tube will be taken out. Pressure will be held on your cut. This helps stop bleeding. A bandage will be put over your cut. The exact procedure may vary among doctors and hospitals. What happens after the procedure? You will be watched until you leave the hospital or clinic. This  includes checking your heart rate, breathing rate, oxygen, and blood pressure. Your cut will be watched for bleeding. You will need to lie still for a few hours. Do not drive for 24 hours or as long as your doctor tells you. Summary Cardiac ablation is a procedure to destroy some heart tissue. This is done to treat heart rhythm problems. Tell your doctor  about any medical conditions you may have. Tell him or her about all medicines you are taking to treat them. This is a safe procedure. But problems may occur. These include infection, bruising, bleeding, and damage to nearby areas of your body. Follow what your doctor tells you about food and drink. You may also be told to change or stop some of your medicines. After the procedure, do not drive for 24 hours or as long as your doctor tells you. This information is not intended to replace advice given to you by your health care provider. Make sure you discuss any questions you have with your health care provider. Document Revised: 07/30/2019 Document Reviewed: 07/30/2019 Elsevier Patient Education  2022 [employer].

## 2021-07-12 ENCOUNTER — Other Ambulatory Visit: Payer: Self-pay | Admitting: Cardiology

## 2021-08-31 ENCOUNTER — Other Ambulatory Visit: Payer: Self-pay | Admitting: Cardiology

## 2021-09-05 ENCOUNTER — Other Ambulatory Visit: Payer: Self-pay

## 2021-09-05 ENCOUNTER — Other Ambulatory Visit: Payer: Medicare Other | Admitting: *Deleted

## 2021-09-05 DIAGNOSIS — I480 Paroxysmal atrial fibrillation: Secondary | ICD-10-CM

## 2021-09-05 DIAGNOSIS — I4819 Other persistent atrial fibrillation: Secondary | ICD-10-CM

## 2021-09-06 LAB — BASIC METABOLIC PANEL
BUN/Creatinine Ratio: 16 (ref 12–28)
BUN: 11 mg/dL (ref 8–27)
CO2: 26 mmol/L (ref 20–29)
Calcium: 9.4 mg/dL (ref 8.7–10.3)
Chloride: 103 mmol/L (ref 96–106)
Creatinine, Ser: 0.69 mg/dL (ref 0.57–1.00)
Glucose: 95 mg/dL (ref 70–99)
Potassium: 4.2 mmol/L (ref 3.5–5.2)
Sodium: 143 mmol/L (ref 134–144)
eGFR: 92 mL/min/{1.73_m2} (ref >59)

## 2021-09-06 LAB — CBC
Hematocrit: 39 % (ref 34.0–46.6)
Hemoglobin: 13.1 g/dL (ref 11.1–15.9)
MCH: 31.1 pg (ref 26.6–33.0)
MCHC: 33.6 g/dL (ref 31.5–35.7)
MCV: 93 fL (ref 79–97)
Platelets: 199 10*3/uL (ref 150–450)
RBC: 4.21 x10E6/uL (ref 3.77–5.28)
RDW: 13 % (ref 11.7–15.4)
WBC: 7.3 10*3/uL (ref 3.4–10.8)

## 2021-09-08 ENCOUNTER — Telehealth (HOSPITAL_COMMUNITY): Payer: Self-pay | Admitting: *Deleted

## 2021-09-08 NOTE — Telephone Encounter (Signed)
Attempted to call patient regarding upcoming cardiac CT appointment. °Left message on voicemail with name and callback number ° °Hoang Reich RN Navigator Cardiac Imaging °[facility] °[phone] Office °[phone] Cell ° °

## 2021-09-08 NOTE — Telephone Encounter (Signed)
Reaching out to patient to offer assistance regarding upcoming cardiac imaging study; pt verbalizes understanding of appt date/time, parking situation and where to check in, pre-test NPO status and verified current allergies; name and call back number provided for further questions should they arise  Gordy Clement RN Navigator Cardiac [place] [facility] [phone] office [phone] cell   Patient aware to arrive at 1pm for her 1:30pm scan.

## 2021-09-12 ENCOUNTER — Other Ambulatory Visit: Payer: Self-pay

## 2021-09-12 ENCOUNTER — Telehealth: Payer: Self-pay | Admitting: Cardiology

## 2021-09-12 ENCOUNTER — Ambulatory Visit (HOSPITAL_COMMUNITY)
Admission: RE | Admit: 2021-09-12 | Discharge: 2021-09-12 | Disposition: A | Discharge status: Home / Self Care | Payer: Medicare Other | Source: Ambulatory Visit | Attending: Cardiology | Admitting: Cardiology

## 2021-09-12 DIAGNOSIS — I4819 Other persistent atrial fibrillation: Secondary | ICD-10-CM

## 2021-09-12 MED ORDER — IOHEXOL 350 MG/ML SOLN
95.0000 mL | Freq: Once | INTRAVENOUS | Status: AC | PRN
Start: 2021-09-12 — End: 2021-09-12
  Administered 2021-09-12: 95 mL via INTRAVENOUS

## 2021-09-12 NOTE — Telephone Encounter (Signed)
Spoke with the patient who states that she missed her dose of Eliquis last night. She states that she is unsure whether she took her Eliquis this morning. She does not use a pill box for her AM medications and does not recall whether she took Eliquis or her other morning medications.  Spoke with Dr. Ileana Ladd who advised for patient to go ahead with CT scan and ablation as scheduled. Advised patient that it is very important for her to not miss any more doses of Eliquis and recommended that she use a pill box for all of her medications. Patient verbalized understanding.

## 2021-09-12 NOTE — Telephone Encounter (Signed)
Patient missed a dose of Eliquis. She wanted to know if that would affect her upcoming ablation.  She is also scheduled to have a CT done today. She wants to know if her CT needs to be cancelled.  Please advise

## 2021-09-18 NOTE — Pre-Procedure Instructions (Signed)
Instructed patient on the following items: Arrival time 0530 Nothing to eat or drink after midnight No meds AM of procedure Responsible person to drive you home and stay with you for 24 hrs  Have you missed any doses of anti-coagulant Eliquis- missed 1 dose, Dr Curt Bears aware

## 2021-09-18 NOTE — Anesthesia Preprocedure Evaluation (Addendum)
Anesthesia Evaluation  Patient identified by MRN, date of birth, ID band Patient awake    Reviewed: Allergy & Precautions, NPO status , Patient's Chart, lab work & pertinent test results, reviewed documented beta blocker date and time   Airway Mallampati: III  TM Distance: >3 FB Neck ROM: Full    Dental  (+) Teeth Intact, Dental Advisory Given   Pulmonary sleep apnea and Continuous Positive Airway Pressure Ventilation ,    Pulmonary exam normal breath sounds clear to auscultation       Cardiovascular hypertension, Pt. on home beta blockers and Pt. on medications Normal cardiovascular exam+ dysrhythmias Atrial Fibrillation  Rhythm:Regular Rate:Normal     Neuro/Psych PSYCHIATRIC DISORDERS Depression negative neurological ROS     GI/Hepatic negative GI ROS, Neg liver ROS,   Endo/Other  negative endocrine ROS  Renal/GU negative Renal ROS     Musculoskeletal  (+) Arthritis ,   Abdominal   Peds  Hematology  (+) Blood dyscrasia (Eliquis), ,   Anesthesia Other Findings   Reproductive/Obstetrics                            Anesthesia Physical Anesthesia Plan  ASA: 3  Anesthesia Plan: General   Post-op Pain Management: Tylenol PO (pre-op)   Induction: Intravenous  PONV Risk Score and Plan: 3 and Dexamethasone, Ondansetron, Treatment may vary due to age or medical condition and Propofol infusion  Airway Management Planned: Oral ETT  Additional Equipment:   Intra-op Plan:   Post-operative Plan: Extubation in OR  Informed Consent: I have reviewed the patients History and Physical, chart, labs and discussed the procedure including the risks, benefits and alternatives for the proposed anesthesia with the patient or authorized representative who has indicated his/her understanding and acceptance.     Dental advisory given  Plan Discussed with: CRNA  Anesthesia Plan Comments:        Anesthesia Quick Evaluation

## 2021-09-19 ENCOUNTER — Encounter (HOSPITAL_COMMUNITY)
Admission: RE | Disposition: A | Discharge status: Home / Self Care | Payer: Self-pay | Source: Home / Self Care | Attending: Cardiology

## 2021-09-19 ENCOUNTER — Ambulatory Visit (HOSPITAL_COMMUNITY): Payer: Medicare Other | Admitting: Anesthesiology

## 2021-09-19 ENCOUNTER — Other Ambulatory Visit: Payer: Self-pay

## 2021-09-19 ENCOUNTER — Encounter (HOSPITAL_COMMUNITY): Payer: Self-pay | Admitting: Cardiology

## 2021-09-19 ENCOUNTER — Ambulatory Visit (HOSPITAL_COMMUNITY)
Admission: RE | Admit: 2021-09-19 | Discharge: 2021-09-19 | Disposition: A | Discharge status: Home / Self Care | Payer: Medicare Other | Attending: Cardiology | Admitting: Cardiology

## 2021-09-19 DIAGNOSIS — E785 Hyperlipidemia, unspecified: Secondary | ICD-10-CM | POA: Insufficient documentation

## 2021-09-19 DIAGNOSIS — I10 Essential (primary) hypertension: Secondary | ICD-10-CM | POA: Diagnosis not present

## 2021-09-19 DIAGNOSIS — I480 Paroxysmal atrial fibrillation: Secondary | ICD-10-CM | POA: Insufficient documentation

## 2021-09-19 HISTORY — PX: ATRIAL FIBRILLATION ABLATION: EP1191

## 2021-09-19 LAB — POCT ACTIVATED CLOTTING TIME
Activated Clotting Time: 389 seconds
Activated Clotting Time: 456 seconds

## 2021-09-19 SURGERY — ATRIAL FIBRILLATION ABLATION
Anesthesia: General

## 2021-09-19 MED ORDER — PROTAMINE SULFATE 10 MG/ML IV SOLN
INTRAVENOUS | Status: DC | PRN
Start: 2021-09-19 — End: 2021-09-19
  Administered 2021-09-19: 60 mg via INTRAVENOUS

## 2021-09-19 MED ORDER — DEXAMETHASONE SODIUM PHOSPHATE 10 MG/ML IJ SOLN
INTRAMUSCULAR | Status: DC | PRN
Start: 2021-09-19 — End: 2021-09-19
  Administered 2021-09-19: 5 mg via INTRAVENOUS

## 2021-09-19 MED ORDER — SODIUM CHLORIDE 0.9 % IV SOLN: 250.0000 mL | INTRAVENOUS | Status: DC | PRN

## 2021-09-19 MED ORDER — SODIUM CHLORIDE 0.9% FLUSH: 3.0000 mL | Freq: Two times a day (BID) | INTRAVENOUS | Status: DC

## 2021-09-19 MED ORDER — FENTANYL CITRATE (PF) 100 MCG/2ML IJ SOLN
INTRAMUSCULAR | Status: DC | PRN
  Administered 2021-09-19: 100 ug via INTRAVENOUS

## 2021-09-19 MED ORDER — APIXABAN 5 MG PO TABS
5.0000 mg | ORAL_TABLET | Freq: Once | ORAL | Status: AC
  Administered 2021-09-19: 5 mg via ORAL
  Filled 2021-09-19: qty 1

## 2021-09-19 MED ORDER — HEPARIN (PORCINE) IN NACL 1000-0.9 UT/500ML-% IV SOLN
INTRAVENOUS | Status: DC | PRN
  Administered 2021-09-19 (×5): 500 mL

## 2021-09-19 MED ORDER — SODIUM CHLORIDE 0.9% FLUSH: 3.0000 mL | INTRAVENOUS | Status: DC | PRN

## 2021-09-19 MED ORDER — HEPARIN SODIUM (PORCINE) 1000 UNIT/ML IJ SOLN
INTRAMUSCULAR | Status: DC | PRN
  Administered 2021-09-19: 13000 [IU] via INTRAVENOUS

## 2021-09-19 MED ORDER — LIDOCAINE 2% (20 MG/ML) 5 ML SYRINGE
INTRAMUSCULAR | Status: DC | PRN
Start: 2021-09-19 — End: 2021-09-19
  Administered 2021-09-19: 60 mg via INTRAVENOUS

## 2021-09-19 MED ORDER — ONDANSETRON HCL 4 MG/2ML IJ SOLN: 4.0000 mg | Freq: Four times a day (QID) | INTRAMUSCULAR | Status: DC | PRN

## 2021-09-19 MED ORDER — PROPOFOL 500 MG/50ML IV EMUL: INTRAVENOUS | Status: DC | PRN

## 2021-09-19 MED ORDER — PROPOFOL 500 MG/50ML IV EMUL
INTRAVENOUS | Status: DC | PRN
  Administered 2021-09-19: 20 ug/kg/min via INTRAVENOUS

## 2021-09-19 MED ORDER — HEPARIN (PORCINE) IN NACL 1000-0.9 UT/500ML-% IV SOLN
INTRAVENOUS | Status: AC
  Filled 2021-09-19: qty 2000

## 2021-09-19 MED ORDER — ACETAMINOPHEN 500 MG PO TABS
1000.0000 mg | ORAL_TABLET | Freq: Once | ORAL | Status: AC
  Administered 2021-09-19: 1000 mg via ORAL
  Filled 2021-09-19: qty 2

## 2021-09-19 MED ORDER — EPHEDRINE SULFATE-NACL 50-0.9 MG/10ML-% IV SOSY
PREFILLED_SYRINGE | INTRAVENOUS | Status: DC | PRN
Start: 2021-09-19 — End: 2021-09-19
  Administered 2021-09-19 (×2): 10 mg via INTRAVENOUS

## 2021-09-19 MED ORDER — DOBUTAMINE INFUSION FOR EP/ECHO/NUC (1000 MCG/ML)
INTRAVENOUS | Status: DC | PRN
  Administered 2021-09-19: 20 ug/kg/min via INTRAVENOUS

## 2021-09-19 MED ORDER — ACETAMINOPHEN 325 MG PO TABS: 650.0000 mg | ORAL_TABLET | ORAL | Status: DC | PRN

## 2021-09-19 MED ORDER — HEPARIN SODIUM (PORCINE) 1000 UNIT/ML IJ SOLN
INTRAMUSCULAR | Status: DC | PRN
  Administered 2021-09-19: 1000 [IU] via INTRAVENOUS

## 2021-09-19 MED ORDER — PROPOFOL 10 MG/ML IV BOLUS
INTRAVENOUS | Status: DC | PRN
  Administered 2021-09-19: 110 mg via INTRAVENOUS

## 2021-09-19 MED ORDER — SODIUM CHLORIDE 0.9 % IV SOLN: INTRAVENOUS | Status: DC

## 2021-09-19 MED ORDER — PHENYLEPHRINE 40 MCG/ML (10ML) SYRINGE FOR IV PUSH (FOR BLOOD PRESSURE SUPPORT)
PREFILLED_SYRINGE | INTRAVENOUS | Status: DC | PRN
  Administered 2021-09-19: 40 ug via INTRAVENOUS
  Administered 2021-09-19: 80 ug via INTRAVENOUS

## 2021-09-19 MED ORDER — SUGAMMADEX SODIUM 200 MG/2ML IV SOLN
INTRAVENOUS | Status: DC | PRN
Start: 2021-09-19 — End: 2021-09-19
  Administered 2021-09-19: 200 mg via INTRAVENOUS

## 2021-09-19 MED ORDER — DOBUTAMINE INFUSION FOR EP/ECHO/NUC (1000 MCG/ML)
INTRAVENOUS | Status: AC
  Filled 2021-09-19: qty 250

## 2021-09-19 MED ORDER — ROCURONIUM BROMIDE 10 MG/ML (PF) SYRINGE
PREFILLED_SYRINGE | INTRAVENOUS | Status: DC | PRN
  Administered 2021-09-19: 50 mg via INTRAVENOUS
  Administered 2021-09-19: 10 mg via INTRAVENOUS
  Administered 2021-09-19: 20 mg via INTRAVENOUS

## 2021-09-19 MED ORDER — MIDAZOLAM HCL 2 MG/2ML IJ SOLN
INTRAMUSCULAR | Status: DC | PRN
  Administered 2021-09-19: 2 mg via INTRAVENOUS

## 2021-09-19 MED ORDER — ONDANSETRON HCL 4 MG/2ML IJ SOLN
INTRAMUSCULAR | Status: DC | PRN
  Administered 2021-09-19: 4 mg via INTRAVENOUS

## 2021-09-19 MED ORDER — HEPARIN SODIUM (PORCINE) 1000 UNIT/ML IJ SOLN
INTRAMUSCULAR | Status: AC
  Filled 2021-09-19: qty 10

## 2021-09-19 SURGICAL SUPPLY — 20 items
BAG SNAP BAND KOVER 36X36 (MISCELLANEOUS) ×2 IMPLANT
BLANKET WARM UNDERBOD FULL ACC (MISCELLANEOUS) ×3 IMPLANT
CATH OCTARAY 2.0 F 3-3-3-3-3 (CATHETERS) ×2 IMPLANT
CATH S CIRCA THERM PROBE 10F (CATHETERS) ×2 IMPLANT
CATH SMTCH THERMOCOOL SF DF (CATHETERS) ×2 IMPLANT
CATH SOUNDSTAR ECO 8FR (CATHETERS) ×2 IMPLANT
CATH WEBSTER BI DIR CS D-F CRV (CATHETERS) ×2 IMPLANT
CLOSURE PERCLOSE PROSTYLE (VASCULAR PRODUCTS) ×8 IMPLANT
COVER SWIFTLINK CONNECTOR (BAG) ×3 IMPLANT
KIT CATH VERSACROSS STEERABLE (CATHETERS) ×2 IMPLANT
PACK EP LATEX FREE (CUSTOM PROCEDURE TRAY) ×3
PACK EP LF (CUSTOM PROCEDURE TRAY) ×1 IMPLANT
PAD DEFIB RADIO PHYSIO CONN (PAD) ×3 IMPLANT
PATCH CARTO3 (PAD) ×2 IMPLANT
SHEATH CARTO VIZIGO SM CVD (SHEATH) ×2 IMPLANT
SHEATH PINNACLE 7F 10CM (SHEATH) ×2 IMPLANT
SHEATH PINNACLE 8F 10CM (SHEATH) ×4 IMPLANT
SHEATH PINNACLE 9F 10CM (SHEATH) ×2 IMPLANT
SHEATH PROBE COVER 6X72 (BAG) ×2 IMPLANT
TUBING SMART ABLATE COOLFLOW (TUBING) ×2 IMPLANT

## 2021-09-19 NOTE — Anesthesia Postprocedure Evaluation (Signed)
Anesthesia Post Note  Patient: Nancy Oconnell  Procedure(s) Performed: ATRIAL FIBRILLATION ABLATION     Patient location during evaluation: Cath Lab Anesthesia Type: General Level of consciousness: awake and alert Pain management: pain level controlled Vital Signs Assessment: post-procedure vital signs reviewed and stable Respiratory status: spontaneous breathing, nonlabored ventilation, respiratory function stable and patient connected to nasal cannula oxygen Cardiovascular status: blood pressure returned to baseline and stable Postop Assessment: no apparent nausea or vomiting Anesthetic complications: no   There were no known notable events for this encounter.  Last Vitals:  Vitals:   09/19/21 1200 09/19/21 1300  BP: 127/70 126/66  Pulse: 72 72  Resp: 16 19  Temp:    SpO2: 98% 100%    Last Pain:  Vitals:   09/19/21 1020  TempSrc:   PainSc: 0-No pain                 Catalina Gravel
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
## 2021-09-19 NOTE — Transfer of Care (Signed)
Immediate Anesthesia Transfer of Care Note  Patient: Nancy Oconnell  Procedure(s) Performed: ATRIAL FIBRILLATION ABLATION  Patient Location: Cath Lab  Anesthesia Type:General  Level of Consciousness: drowsy and patient cooperative  Airway & Oxygen Therapy: Patient Spontanous Breathing and Patient connected to nasal cannula oxygen  Post-op Assessment: Report given to RN and Post -op Vital signs reviewed and stable  Post vital signs: Reviewed and stable  Last Vitals:  Vitals Value Taken Time  BP 121/63 09/19/21 0934  Temp    Pulse 85 09/19/21 0935  Resp 17 09/19/21 0935  SpO2 100 % 09/19/21 0935  Vitals shown include unvalidated device data.  Last Pain:  Vitals:   09/19/21 0921  TempSrc:   PainSc: 0-No pain         Complications: There were no known notable events for this encounter.

## 2021-09-19 NOTE — Anesthesia Procedure Notes (Signed)
Procedure Name: Intubation Date/Time: 09/19/2021 7:49 AM Performed by: Moshe Salisbury, CRNA Pre-anesthesia Checklist: Patient identified, Emergency Drugs available, Suction available and Patient being monitored Patient Re-evaluated:Patient Re-evaluated prior to induction Oxygen Delivery Method: Circle System Utilized Preoxygenation: Pre-oxygenation with 100% oxygen Induction Type: IV induction Ventilation: Mask ventilation without difficulty Laryngoscope Size: Mac and 3 Grade View: Grade I Tube type: Oral Tube size: 7.0 mm Number of attempts: 1 Airway Equipment and Method: Stylet Placement Confirmation: ETT inserted through vocal cords under direct vision, positive ETCO2 and breath sounds checked- equal and bilateral Secured at: 21 cm Tube secured with: Tape Dental Injury: Teeth and Oropharynx as per pre-operative assessment

## 2021-09-19 NOTE — Discharge Instructions (Signed)

## 2021-09-20 ENCOUNTER — Telehealth: Payer: Self-pay | Admitting: Cardiology

## 2021-09-20 NOTE — Telephone Encounter (Signed)
Pt called with elevated BP 158/97 to 161/87 and flushed face.  No chest pain and no atrial fib.  P 88 to 91, her ablation was yesterday she will take another half of bisoprolol now and recheck BP in an hour.   If improved no further treatment.

## 2021-09-24 ENCOUNTER — Encounter: Payer: Self-pay | Admitting: Cardiology

## 2021-09-26 ENCOUNTER — Telehealth: Payer: Self-pay | Admitting: *Deleted

## 2021-09-26 NOTE — Telephone Encounter (Signed)
Rn called patient and asked if patient could come 9:40 am on 09/27/21 instead of 11:40 am  Patient sated yes , appointment changed

## 2021-09-27 ENCOUNTER — Ambulatory Visit (INDEPENDENT_AMBULATORY_CARE_PROVIDER_SITE_OTHER): Payer: Medicare Other | Admitting: Cardiology

## 2021-09-27 ENCOUNTER — Other Ambulatory Visit: Payer: Self-pay

## 2021-09-27 ENCOUNTER — Ambulatory Visit: Payer: Medicare Other | Admitting: Cardiology

## 2021-09-27 VITALS — BP 126/72 | HR 66 | Ht 63.0 in | Wt 127.4 lb

## 2021-09-27 DIAGNOSIS — G4733 Obstructive sleep apnea (adult) (pediatric): Secondary | ICD-10-CM

## 2021-09-27 DIAGNOSIS — I480 Paroxysmal atrial fibrillation: Secondary | ICD-10-CM

## 2021-09-27 DIAGNOSIS — R030 Elevated blood-pressure reading, without diagnosis of hypertension: Secondary | ICD-10-CM | POA: Diagnosis not present

## 2021-09-27 DIAGNOSIS — E782 Mixed hyperlipidemia: Secondary | ICD-10-CM | POA: Diagnosis not present

## 2021-09-27 DIAGNOSIS — Z7901 Long term (current) use of anticoagulants: Secondary | ICD-10-CM

## 2021-09-27 MED ORDER — BISOPROLOL FUMARATE 5 MG PO TABS: 2.5000 mg | ORAL_TABLET | Freq: Every day | ORAL | 3 refills | Status: DC
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
## 2021-09-27 NOTE — Patient Instructions (Signed)
Medication Instructions:   Faythe Ghee to  take an extra Bisoprolol  if need for elevated blood pressure   Per Dr Ellyn Hack  ,it will be okay to use a low dose  statin if needed for elevated cholesterol - depending on level  drawn by Dr Melford Aase later this year.  *If you need a refill on your cardiac medications before your next appointment, please call your pharmacy*   Lab Work:  Not needed   Testing/Procedures:  Not needed  Follow-Up: At [facility], you and your health needs are our priority.  As part of our continuing mission to provide you with exceptional heart care, we have created designated Provider Care Teams.  These Care Teams include your primary Cardiologist (physician) and Advanced Practice Providers (APPs -  Physician Assistants and Nurse Practitioners) who all work together to provide you with the care you need, when you need it.     Your next appointment:    9 to 10 month(s)  The format for your next appointment:   In Person  Provider:   Glenetta Hew, MD

## 2021-09-28 ENCOUNTER — Encounter: Payer: Self-pay | Admitting: Cardiology

## 2021-09-28 NOTE — Assessment & Plan Note (Signed)
I think her bump up in blood pressure was probably related to missing a dose of bisoprolol.  Perhaps even 2 doses.  But we did confirm that it clearly helps her blood pressure to take additional dose of bisoprolol.  As such, she will continue her 2.5 mg bisoprolol and use an additional dose as needed for systolic pressures that are above 150 mmHg.

## 2021-09-28 NOTE — Assessment & Plan Note (Signed)
Continue CPAP.  

## 2021-09-28 NOTE — Assessment & Plan Note (Signed)
Most recent lipid panel showed LDL of 133 which is high from [facility] pain.  She is due to get her rechecked by her primary care provider soon.  She asked what I thought about treating her lipids.  I agreed that it would probably be beneficial to try to keep her LDL less than 100.  If in fact it is still above where her previous baseline has been hovering roughly 100-210, I think is not unreasonable to start low-dose of statin.  My recommendation will be to start with rosuvastatin at either 5 or 10 mg, depending on her upcoming lab draw.

## 2021-09-28 NOTE — Assessment & Plan Note (Signed)
Recent significant episodes of recurrent A. fib.  We have adjusted her flecainide dose to taking 2 tablets in the evening and 1 in the day.  She is doing much better with this in combination with bisoprolol-with no breakthrough spells..  However she would prefer to avoid being on long-term anticoagulation and antiarrhythmic agent.  Therefore we referred her for A. fib ablation.  She is now postop day 7 from A. fib ablation with Dr. Curt Bears.  She is due to see him soon and I deferred most questions about timing and intervals of flecainide dosing as well as when she can stop DOAC to Dr. Curt Bears.  My clinician will be that she can stay on the current dose of flecainide at least for now during the first 103-month post ablation timeframe in order to help prevent recurrence.  We can then probably start weaning her off of the flecainide.  Would continue bisoprolol as it seems to be doing a good job with her blood pressure as well.  It also helps her anxiety.  The next question will be timing of when to discontinue DOAC.  I also deferred this to Dr. Curt Bears.

## 2021-09-28 NOTE — Assessment & Plan Note (Signed)
Doing well on apixaban, however she would prefer to not have to take it long-term.  Hopefully with a successful ablation, this can be weaned off as well.  Will defer to Dr. Curt Bears.

## 2021-10-17 ENCOUNTER — Encounter: Payer: Self-pay | Admitting: Cardiology

## 2021-10-17 ENCOUNTER — Other Ambulatory Visit: Payer: Self-pay

## 2021-10-17 ENCOUNTER — Ambulatory Visit (HOSPITAL_COMMUNITY)
Admission: RE | Admit: 2021-10-17 | Discharge: 2021-10-17 | Disposition: A | Discharge status: Home / Self Care | Payer: Medicare Other | Source: Ambulatory Visit | Attending: Physician Assistant | Admitting: Physician Assistant

## 2021-10-17 VITALS — BP 136/80 | HR 63 | Ht 63.0 in | Wt 126.4 lb

## 2021-10-17 DIAGNOSIS — Z9989 Dependence on other enabling machines and devices: Secondary | ICD-10-CM | POA: Diagnosis not present

## 2021-10-17 DIAGNOSIS — E785 Hyperlipidemia, unspecified: Secondary | ICD-10-CM | POA: Diagnosis not present

## 2021-10-17 DIAGNOSIS — I440 Atrioventricular block, first degree: Secondary | ICD-10-CM | POA: Diagnosis not present

## 2021-10-17 DIAGNOSIS — D6869 Other thrombophilia: Secondary | ICD-10-CM | POA: Diagnosis not present

## 2021-10-17 DIAGNOSIS — Z853 Personal history of malignant neoplasm of breast: Secondary | ICD-10-CM | POA: Diagnosis not present

## 2021-10-17 DIAGNOSIS — I10 Essential (primary) hypertension: Secondary | ICD-10-CM | POA: Insufficient documentation

## 2021-10-17 DIAGNOSIS — Z8249 Family history of ischemic heart disease and other diseases of the circulatory system: Secondary | ICD-10-CM | POA: Diagnosis not present

## 2021-10-17 DIAGNOSIS — G4733 Obstructive sleep apnea (adult) (pediatric): Secondary | ICD-10-CM | POA: Diagnosis not present

## 2021-10-17 DIAGNOSIS — Z79899 Other long term (current) drug therapy: Secondary | ICD-10-CM | POA: Insufficient documentation

## 2021-10-17 DIAGNOSIS — I480 Paroxysmal atrial fibrillation: Secondary | ICD-10-CM | POA: Insufficient documentation

## 2021-10-17 DIAGNOSIS — Z7901 Long term (current) use of anticoagulants: Secondary | ICD-10-CM | POA: Diagnosis not present
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
## 2021-10-17 NOTE — Telephone Encounter (Signed)
Note was dictated and signed.  Should have been forwarded to Dr. Melford Aase.
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
## 2021-11-10 ENCOUNTER — Encounter (HOSPITAL_COMMUNITY): Payer: Self-pay | Admitting: Physician Assistant

## 2021-11-10 ENCOUNTER — Ambulatory Visit (HOSPITAL_COMMUNITY)
Admission: RE | Admit: 2021-11-10 | Discharge: 2021-11-10 | Disposition: A | Discharge status: Home / Self Care | Payer: Medicare Other | Source: Ambulatory Visit | Attending: Physician Assistant | Admitting: Physician Assistant

## 2021-11-10 ENCOUNTER — Other Ambulatory Visit: Payer: Self-pay

## 2021-11-10 VITALS — BP 142/92 | HR 72 | Ht 63.0 in | Wt 122.8 lb

## 2021-11-10 DIAGNOSIS — I4891 Unspecified atrial fibrillation: Secondary | ICD-10-CM

## 2021-11-10 DIAGNOSIS — I480 Paroxysmal atrial fibrillation: Secondary | ICD-10-CM | POA: Diagnosis present

## 2021-11-10 DIAGNOSIS — Z853 Personal history of malignant neoplasm of breast: Secondary | ICD-10-CM | POA: Diagnosis not present

## 2021-11-10 DIAGNOSIS — E785 Hyperlipidemia, unspecified: Secondary | ICD-10-CM | POA: Insufficient documentation

## 2021-11-10 DIAGNOSIS — D6869 Other thrombophilia: Secondary | ICD-10-CM | POA: Diagnosis not present

## 2021-11-10 DIAGNOSIS — G4733 Obstructive sleep apnea (adult) (pediatric): Secondary | ICD-10-CM | POA: Diagnosis not present

## 2021-11-10 DIAGNOSIS — R0600 Dyspnea, unspecified: Secondary | ICD-10-CM | POA: Diagnosis not present

## 2021-11-10 DIAGNOSIS — Z8249 Family history of ischemic heart disease and other diseases of the circulatory system: Secondary | ICD-10-CM | POA: Diagnosis not present

## 2021-11-10 DIAGNOSIS — Z79899 Other long term (current) drug therapy: Secondary | ICD-10-CM | POA: Diagnosis not present

## 2021-11-10 DIAGNOSIS — I10 Essential (primary) hypertension: Secondary | ICD-10-CM | POA: Insufficient documentation

## 2021-11-10 DIAGNOSIS — Z7901 Long term (current) use of anticoagulants: Secondary | ICD-10-CM | POA: Diagnosis not present

## 2021-11-10 LAB — CBC
HCT: 44.2 % (ref 36.0–46.0)
Hemoglobin: 14.2 g/dL (ref 12.0–15.0)
MCH: 30.8 pg (ref 26.0–34.0)
MCHC: 32.1 g/dL (ref 30.0–36.0)
MCV: 95.9 fL (ref 80.0–100.0)
Platelets: 192 10*3/uL (ref 150–400)
RBC: 4.61 MIL/uL (ref 3.87–5.11)
RDW: 12.9 % (ref 11.5–15.5)
WBC: 5.1 10*3/uL (ref 4.0–10.5)
nRBC: 0 % (ref 0.0–0.2)

## 2021-11-10 LAB — BRAIN NATRIURETIC PEPTIDE: B Natriuretic Peptide: 87.2 pg/mL (ref 0.0–100.0)
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
## 2021-11-21 ENCOUNTER — Encounter (HOSPITAL_COMMUNITY): Payer: Self-pay | Admitting: *Deleted

## 2021-11-21 ENCOUNTER — Ambulatory Visit (HOSPITAL_COMMUNITY)
Admission: RE | Admit: 2021-11-21 | Discharge: 2021-11-21 | Disposition: A | Discharge status: Home / Self Care | Payer: Medicare Other | Source: Ambulatory Visit | Attending: Physician Assistant | Admitting: Physician Assistant

## 2021-11-21 ENCOUNTER — Other Ambulatory Visit: Payer: Self-pay

## 2021-11-21 DIAGNOSIS — I4891 Unspecified atrial fibrillation: Secondary | ICD-10-CM | POA: Diagnosis present

## 2021-11-21 DIAGNOSIS — E785 Hyperlipidemia, unspecified: Secondary | ICD-10-CM | POA: Diagnosis not present

## 2021-11-21 DIAGNOSIS — I480 Paroxysmal atrial fibrillation: Secondary | ICD-10-CM

## 2021-11-21 DIAGNOSIS — I10 Essential (primary) hypertension: Secondary | ICD-10-CM | POA: Insufficient documentation

## 2021-11-21 DIAGNOSIS — I340 Nonrheumatic mitral (valve) insufficiency: Secondary | ICD-10-CM | POA: Insufficient documentation

## 2021-11-21 LAB — ECHOCARDIOGRAM COMPLETE
Area-P 1/2: 3.3 cm2
MV M vel: 4.72 m/s
MV Peak grad: 89.1 mmHg
Radius: 0.4 cm
S' Lateral: 2.5 cm

## 2021-12-19 ENCOUNTER — Other Ambulatory Visit: Payer: Self-pay | Admitting: Cardiology

## 2021-12-19 NOTE — Telephone Encounter (Signed)
Prescription refill request for Eliquis received. ?Indication: Afib  ?Last office visit:11/10/21 Nancy Oconnell) ?Scr: 0.69 (09/05/21)  ?Age: 76 ?Weight: 55.7kg ? ?Appropriate dose and refill sent to requested pharmacy.  ?

## 2021-12-25 ENCOUNTER — Ambulatory Visit (INDEPENDENT_AMBULATORY_CARE_PROVIDER_SITE_OTHER): Payer: Medicare Other | Admitting: Cardiology

## 2021-12-25 ENCOUNTER — Encounter: Payer: Self-pay | Admitting: Cardiology

## 2021-12-25 VITALS — BP 124/76 | HR 70 | Ht 63.0 in | Wt 128.0 lb

## 2021-12-25 DIAGNOSIS — I480 Paroxysmal atrial fibrillation: Secondary | ICD-10-CM | POA: Diagnosis not present

## 2021-12-25 NOTE — Patient Instructions (Signed)
Medication Instructions:  ?Your physician has recommended you make the following change in your medication:  ?STOP Flecainide ? ?*If you need a refill on your cardiac medications before your next appointment, please call your pharmacy* ? ? ?Lab Work: ?None ordered ? ? ?Testing/Procedures: ?None ordered ? ? ?Follow-Up: ?At [facility], you and your health needs are our priority.  As part of our continuing mission to provide you with exceptional heart care, we have created designated Provider Care Teams.  These Care Teams include your primary Cardiologist (physician) and Advanced Practice Providers (APPs -  Physician Assistants and Nurse Practitioners) who all work together to provide you with the care you need, when you need it. ? ?Your next appointment:   ?6 month(s) ? ?The format for your next appointment:   ?In Person ? ?Provider:   ?You will follow up in the [facility] located at [facility]. ?Your provider will be: ?Roderic Palau, NP or Clint R. Fenton, PA-C ? ? ? ?Thank you for choosing [facility]!! ? ? ?Trinidad Curet, RN ?[phone] ? ?
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
## 2021-12-26 ENCOUNTER — Ambulatory Visit: Payer: Medicare Other | Admitting: Cardiology

## 2022-01-02 ENCOUNTER — Encounter: Payer: Self-pay | Admitting: Cardiology

## 2022-01-12 NOTE — Telephone Encounter (Signed)
Followed up with pt who reports she is doing good. ?Keeping track of BPs at home and they are normal/WNL. ?She is going to keep monitoring and will reach out to office if need to discuss if she begins to see elevated BP readings. ?She appreciates my follow up. ?

## 2022-02-01 ENCOUNTER — Encounter: Payer: Self-pay | Admitting: Cardiology

## 2022-02-02 ENCOUNTER — Telehealth: Payer: Self-pay | Admitting: Cardiology

## 2022-02-02 ENCOUNTER — Encounter: Payer: Self-pay | Admitting: Cardiology

## 2022-02-02 NOTE — Telephone Encounter (Signed)
Spent 35 minutes on phone w/ pt. Pt asking if she should go back on daily Bisoprolol. States she "is fluttering a lot", however she is not taking ekg w/ watch when this occurs.  Instructed to going forward and then send to MD via mychart if needs to review. Reports on 5/22:   148/103, HR 156.  Took extra Metoprolol.  2 hours later HRs still elevated 90-109 "even in bed", but that she eventually just went to sleep. She feels like she had more afib on 5/24 and 5/26. Episodes are lasting several hours or less. She had some dizzy spells/lightheadedness on 5/13 & 5/15 but watch/ecg showed normal rhythm. She also reports she did have some ETOH on 5/11 b/c it was her birthday (had a mojito and bourbon shot). She is taking: Flecainide PRN 50 mg if  needed, and may take extra if needed. Bisoprolol 2.5 mg PRN Metoprolol 12.5 daily PRN, may take extra if needed She is wondering if she should go back on Bisoprolol.  Informed that we agreed to stop earlier this month d/t hair loss and pt reports this did improve after stopping. She then mentions maybe taking a CCB.  Informed she was on Diltiazem years ago but stopped that d/t constipation. Pt willing  to retry daily Diltiazem to see if maybe she doesn't experience it this time.  Informed that I would forward to Dr. Curt Bears for review of trying low dose Diltiazem again, daily, to try and control HRs. Also will discuss if want to do a week-2 week monitor to determine rhythm occurring, pt is agreeable to this if MD is. Also advised pt to take ecg w/ her watch when she has episodes and sent it to [place] for review. Patient verbalized understanding and agreeable to plan.  Aware it will be next week before return call.

## 2022-02-02 NOTE — Telephone Encounter (Signed)
Patient c/o Palpitations:  High priority if patient c/o lightheadedness, shortness of breath, or chest pain  How long have you had palpitations/irregular HR/ Afib? Are you having the symptoms now? This morning when she woke up  Are you currently experiencing lightheadedness, SOB or CP? No  Do you have a history of afib (atrial fibrillation) or irregular heart rhythm? Yes  Have you checked your BP or HR? (document readings if available): 116/75 HR 77 - After taking medications  Are you experiencing any other symptoms? No   Pt also sent MyChart messages as well. Please advise

## 2022-02-03 ENCOUNTER — Encounter: Payer: Self-pay | Admitting: Cardiology

## 2022-02-03 ENCOUNTER — Telehealth: Payer: Self-pay | Admitting: Internal Medicine

## 2022-02-03 NOTE — Telephone Encounter (Signed)
Patient called saying HR is high in low 100s but not Afib now Suggested to take extra half dose of metoprolol No symptoms except palpitations. VS stable If goes in Afib RVR suggested to call back or visit ED

## 2022-02-06 ENCOUNTER — Encounter: Payer: Self-pay | Admitting: Cardiology

## 2022-02-07 ENCOUNTER — Telehealth: Payer: Self-pay | Admitting: Cardiology

## 2022-02-07 NOTE — Telephone Encounter (Signed)
Pt reports that she spoke with on-call over the weekend d/t continued afib. She states she was told she could take a Flecainide/Metoprolol. Advised Dr. Curt Bears recommends restarting Bisoprolol daily. Pt scheduled to see Dr. Curt Bears tomorrow to further discuss issues and update treatment plan. Advised to call back if she has any issues until we can see her tomorrow. Patient verbalized understanding and agreeable to plan.

## 2022-02-07 NOTE — Telephone Encounter (Signed)
Patient c/o Palpitations:  High priority if patient c/o lightheadedness, shortness of breath, or chest pain  How long have you had palpitations/irregular HR/ Afib? Patient states she is in and out of A-Fib since May 24th, she has had 7 episodes. Are you having the symptoms now? no  Are you currently experiencing lightheadedness, SOB or CP? no  Do you have a history of afib (atrial fibrillation) or irregular heart rhythm? Yes history of A-fib  Have you checked your BP or HR? (document readings if available): BP 108/73 8am this morning  Are you experiencing any other symptoms? no

## 2022-02-08 ENCOUNTER — Ambulatory Visit (INDEPENDENT_AMBULATORY_CARE_PROVIDER_SITE_OTHER): Payer: Medicare Other | Admitting: Cardiology

## 2022-02-08 ENCOUNTER — Encounter: Payer: Self-pay | Admitting: Cardiology

## 2022-02-08 VITALS — BP 120/82 | HR 69 | Ht 63.0 in | Wt 128.0 lb

## 2022-02-08 DIAGNOSIS — I480 Paroxysmal atrial fibrillation: Secondary | ICD-10-CM | POA: Diagnosis not present

## 2022-02-08 DIAGNOSIS — D6869 Other thrombophilia: Secondary | ICD-10-CM | POA: Diagnosis not present

## 2022-02-08 MED ORDER — FLECAINIDE ACETATE 100 MG PO TABS: 100.0000 mg | ORAL_TABLET | Freq: Two times a day (BID) | ORAL | 3 refills | Status: DC

## 2022-02-08 MED ORDER — DILTIAZEM HCL ER COATED BEADS 120 MG PO CP24: 120.0000 mg | ORAL_CAPSULE | Freq: Every day | ORAL | 3 refills | Status: DC
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
## 2022-02-08 NOTE — Patient Instructions (Signed)
Medication Instructions:  Your physician has recommended you make the following change in your medication:  STOP Bisoprolol START Flecainide 100 mg twice daily START Diltiazem 120 mg once daily  *If you need a refill on your cardiac medications before your next appointment, please call your pharmacy*   Lab Work: None ordered   Testing/Procedures: None ordered   Follow-Up: At [facility], you and your health needs are our priority.  As part of our continuing mission to provide you with exceptional heart care, we have created designated Provider Care Teams.  These Care Teams include your primary Cardiologist (physician) and Advanced Practice Providers (APPs -  Physician Assistants and Nurse Practitioners) who all work together to provide you with the care you need, when you need it.  Your next appointment:   3 month(s)  The format for your next appointment:   In Person  Provider:   Allegra Lai, MD    Thank you for choosing [facility]!!   Trinidad Curet, RN [phone]  Other Instructions   Important Information About Sugar       Diltiazem Tablets What is this medication? DILTIAZEM (dil TYE a zem) treats high blood pressure and prevents chest pain (angina). It works by relaxing the blood vessels, which helps decrease the amount of work your heart has to do. It belongs to a group of medications called calcium channel blockers. This medicine may be used for other purposes; ask your health care provider or pharmacist if you have questions. COMMON BRAND NAME(S): Cardizem What should I tell my care team before I take this medication? They need to know if you have any of these conditions: Heart attack Heart disease Irregular heartbeat or rhythm Low blood pressure An unusual or allergic reaction to diltiazem, other medications, foods, dyes, or preservatives Pregnant or trying to get pregnant Breast-feeding How should I use this medication? Take this  medication by mouth. Take it as directed on the prescription label at the same time every day. Keep taking it unless your care team tells you to stop. Talk to your care team about the use of this medication in children. Special care may be needed. Overdosage: If you think you have taken too much of this medicine contact a poison control center or emergency room at once. NOTE: This medicine is only for you. Do not share this medicine with others. What if I miss a dose? If you miss a dose, take it as soon as you can. If it is almost time for your next dose, take only that dose. Do not take double or extra doses. What may interact with this medication? Do not take this medication with any of the following: Cisapride Hawthorn Pimozide Ranolazine Red yeast rice This medication may also interact with the following: Buspirone Carbamazepine Cimetidine Cyclosporine Digoxin Local anesthetics or general anesthetics Lovastatin Medications for anxiety or difficulty sleeping like midazolam and triazolam Medications for high blood pressure or heart problems Quinidine Rifampin, rifabutin, or rifapentine This list may not describe all possible interactions. Give your health care provider a list of all the medicines, herbs, non-prescription drugs, or dietary supplements you use. Also tell them if you smoke, drink alcohol, or use illegal drugs. Some items may interact with your medicine. What should I watch for while using this medication? Visit your care team for regular checks on your progress. Check your blood pressure as directed. Ask your care team what your blood pressure should be. Also, find out when you should contact them. Do not  treat yourself for coughs, colds, or pain while you are using this medication without asking your care team for advice. Some medications may increase your blood pressure. This medication may cause serious skin reactions. They can happen weeks to months after starting the  medication. Contact your care team right away if you notice fevers or flu-like symptoms with a rash. The rash may be red or purple and then turn into blisters or peeling of the skin. Or, you might notice a red rash with swelling of the face, lips or lymph nodes in your neck or under your arms. You may get drowsy or dizzy. Do not drive, use machinery, or do anything that needs mental alertness until you know how this medication affects you. Do not stand up or sit up quickly, especially if you are an older patient. This reduces the risk of dizzy or fainting spells. What side effects may I notice from receiving this medication? Side effects that you should report to your care team as soon as possible: Allergic reactions--skin rash, itching, hives, swelling of the face, lips, tongue, or throat Heart failure--shortness of breath, swelling of the ankles, feet, or hands, sudden weight gain, unusual weakness or fatigue Slow heart beat--dizziness, feeling faint or lightheaded, trouble breathing, unusual weakness or fatigue Liver injury--right upper belly pain, loss of appetite, nausea, light-colored stool, dark yellow or brown urine, yellowing skin or eyes, unusual weakness or fatigue Low blood pressure--dizziness, feeling faint or lightheaded, blurry vision Redness, blistering, peeling, or loosening of the skin, including inside the mouth Side effects that usually do not require medical attention (report to your care team if they continue or are bothersome): Constipation Facial flushing, redness Headache This list may not describe all possible side effects. Call your doctor for medical advice about side effects. You may report side effects to FDA at 1-800-FDA-1088. Where should I keep my medication? Keep out of the reach of children and pets. Store at room temperature between 15 and 30 degrees C (59 and 86 degrees F). Protect from moisture. Keep the container tightly closed. Throw away any unused medication  after the expiration date. NOTE: This sheet is a summary. It may not cover all possible information. If you have questions about this medicine, talk to your doctor, pharmacist, or health care provider.  2023 Elsevier/Gold Standard (2021-07-28 00:00:00)     Flecainide Tablets What is this medication? FLECAINIDE (FLEK a nide) prevents and treats a fast or irregular heartbeat (arrhythmia). It is often used to treat a type of arrhythmia known as AFib (atrial fibrillation). It works by slowing down overactive electric signals in the heart, which stabilizes your heart rhythm. It belongs to a group of medications called antiarrhythmics. This medicine may be used for other purposes; ask your health care provider or pharmacist if you have questions. COMMON BRAND NAME(S): Tambocor What should I tell my care team before I take this medication? They need to know if you have any of these conditions: Abnormal levels of potassium in the blood Heart disease including heart rhythm and heart rate problems Kidney or liver disease Recent heart attack An unusual or allergic reaction to flecainide, local anesthetics, other medications, foods, dyes, or preservatives Pregnant or trying to get pregnant Breast-feeding How should I use this medication? Take this medication by mouth with a glass of water. Follow the directions on the prescription label. You can take this medication with or without food. Take your doses at regular intervals. Do not take your medication more often than directed.  Do not stop taking this medication suddenly. This may cause serious, heart-related side effects. If your care team wants you to stop the medication, the dose may be slowly lowered over time to avoid any side effects. Talk to your care team regarding the use of this medication in children. While this medication may be prescribed for children as young as 1 year of age for selected conditions, precautions do apply. Overdosage: If you  think you have taken too much of this medicine contact a poison control center or emergency room at once. NOTE: This medicine is only for you. Do not share this medicine with others. What if I miss a dose? If you miss a dose, take it as soon as you can. If it is almost time for your next dose, take only that dose. Do not take double or extra doses. What may interact with this medication? Do not take this medication with any of the following: Amoxapine Arsenic trioxide Certain antibiotics like clarithromycin, erythromycin, gatifloxacin, gemifloxacin, levofloxacin, moxifloxacin, sparfloxacin, or troleandomycin Certain antidepressants called tricyclic antidepressants like amitriptyline, imipramine, or nortriptyline Certain medications to control heart rhythm like disopyramide, encainide, moricizine, procainamide, propafenone, and quinidine Cisapride Delavirdine Droperidol Haloperidol Hawthorn Imatinib Levomethadyl Maprotiline Medications for malaria like chloroquine and halofantrine Pentamidine Phenothiazines like chlorpromazine, mesoridazine, prochlorperazine, thioridazine Pimozide Quinine Ranolazine Ritonavir Sertindole This medication may also interact with the following: Cimetidine Dofetilide Medications for angina or high blood pressure Medications to control heart rhythm like amiodarone and digoxin Ziprasidone This list may not describe all possible interactions. Give your health care provider a list of all the medicines, herbs, non-prescription drugs, or dietary supplements you use. Also tell them if you smoke, drink alcohol, or use illegal drugs. Some items may interact with your medicine. What should I watch for while using this medication? Visit your care team for regular checks on your progress. Because your condition and the use of this medication carries some risk, it is a good idea to carry an identification card, necklace or bracelet with details of your condition,  medications, and care team. Check your blood pressure and pulse rate regularly. Ask your care team what your blood pressure and pulse rate should be, and when you should contact them. Your care team also may schedule regular blood tests and electrocardiograms to check your progress. You may get drowsy or dizzy. Do not drive, use machinery, or do anything that needs mental alertness until you know how this medication affects you. Do not stand or sit up quickly, especially if you are an older patient. This reduces the risk of dizzy or fainting spells. Alcohol can make you more dizzy, increase flushing and rapid heartbeats. Avoid alcoholic drinks. What side effects may I notice from receiving this medication? Side effects that you should report to your care team as soon as possible: Allergic reactions--skin rash, itching, hives, swelling of the face, lips, tongue, or throat Heart failure--shortness of breath, swelling of the ankles, feet, or hands, sudden weight gain, unusual weakness or fatigue Heart rhythm changes--fast or irregular heartbeat, dizziness, feeling faint or lightheaded, chest pain, trouble breathing Liver injury--right upper belly pain, loss of appetite, nausea, light-colored stool, dark yellow or brown urine, yellowing skin or eyes, unusual weakness or fatigue Side effects that usually do not require medical attention (report to your care team if they continue or are bothersome): Blurry vision Constipation Dizziness Fatigue Headache Nausea Tremors or shaking This list may not describe all possible side effects. Call your doctor for medical advice about  side effects. You may report side effects to FDA at 1-800-FDA-1088. Where should I keep my medication? Keep out of the reach of children and pets. Store at room temperature between 15 and 30 degrees C (59 and 86 degrees F). Protect from light. Keep container tightly closed. Throw away any unused medication after the expiration  date. NOTE: This sheet is a summary. It may not cover all possible information. If you have questions about this medicine, talk to your doctor, pharmacist, or health care provider.  2023 Elsevier/Gold Standard (2020-10-21 00:00:00)

## 2022-02-19 ENCOUNTER — Encounter: Payer: Self-pay | Admitting: Cardiology

## 2022-02-27 ENCOUNTER — Telehealth: Payer: Self-pay | Admitting: *Deleted

## 2022-02-27 NOTE — Telephone Encounter (Signed)
Called pt and set ablation date for 11/2. Aware I will be in touch at later date to review details/instructions Patient verbalized understanding and agreeable to plan.

## 2022-05-08 ENCOUNTER — Encounter: Payer: Self-pay | Admitting: Cardiology

## 2022-05-16 ENCOUNTER — Encounter: Payer: Self-pay | Admitting: *Deleted

## 2022-05-16 ENCOUNTER — Encounter: Payer: Self-pay | Admitting: Cardiology

## 2022-05-16 ENCOUNTER — Ambulatory Visit: Payer: Medicare Other | Attending: Cardiology | Admitting: Cardiology

## 2022-05-16 VITALS — BP 126/74 | HR 72 | Ht 63.0 in | Wt 128.6 lb

## 2022-05-16 DIAGNOSIS — I480 Paroxysmal atrial fibrillation: Secondary | ICD-10-CM

## 2022-05-16 DIAGNOSIS — Z01812 Encounter for preprocedural laboratory examination: Secondary | ICD-10-CM | POA: Diagnosis present
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
## 2022-05-16 NOTE — Patient Instructions (Signed)
Medication Instructions:  Your physician recommends that you continue on your current medications as directed. Please refer to the Current Medication list given to you today.  *If you need a refill on your cardiac medications before your next appointment, please call your pharmacy*   Lab Work: Pre procedure labs -- see procedure instruction letter:  BMP & CBC  If you have labs (blood work) drawn today and your tests are completely normal, you will receive your results only by: MyChart Message (if you have MyChart) OR A paper copy in the mail If you have any lab test that is abnormal or we need to change your treatment, we will call you to review the results.   Testing/Procedures: Your physician has requested that you have cardiac CT within 7 days PRIOR to your ablation. Cardiac computed tomography (CT) is a painless test that uses an x-ray machine to take clear, detailed pictures of your heart.  Please follow instruction below located under "other instructions". You will get a call from our office to schedule the date for this test.  Your physician has recommended that you have an ablation. Catheter ablation is a medical procedure used to treat some cardiac arrhythmias (irregular heartbeats). During catheter ablation, a long, thin, flexible tube is put into a blood vessel in your groin (upper thigh), or neck. This tube is called an ablation catheter. It is then guided to your heart through the blood vessel. Radio frequency waves destroy small areas of heart tissue where abnormal heartbeats may cause an arrhythmia to start. Please follow instruction letter given to you today.   Follow-Up: At [facility], you and your health needs are our priority.  As part of our continuing mission to provide you with exceptional heart care, we have created designated Provider Care Teams.  These Care Teams include your primary Cardiologist (physician) and Advanced Practice Providers (APPs -  Physician  Assistants and Nurse Practitioners) who all work together to provide you with the care you need, when you need it.  Your next appointment:   1 month(s) after your ablation  The format for your next appointment:   In Person  Provider:   AFib clinic   Thank you for choosing [facility]!!   Nancy Tarlton, RN [phone]    Other Instructions   Cardiac Ablation Cardiac ablation is a procedure to destroy (ablate) some heart tissue that is sending bad signals. These bad signals cause problems in heart rhythm. The heart has many areas that make these signals. If there are problems in these areas, they can make the heart beat in a way that is not normal. Destroying some tissues can help make the heart rhythm normal. Tell your doctor about: Any allergies you have. All medicines you are taking. These include vitamins, herbs, eye drops, creams, and over-the-counter medicines. Any problems you or family members have had with medicines that make you fall asleep (anesthetics). Any blood disorders you have. Any surgeries you have had. Any medical conditions you have, such as kidney failure. Whether you are pregnant or may be pregnant. What are the risks? This is a safe procedure. But problems may occur, including: Infection. Bruising and bleeding. Bleeding into the chest. Stroke or blood clots. Damage to nearby areas of your body. Allergies to medicines or dyes. The need for a pacemaker if the normal system is damaged. Failure of the procedure to treat the problem. What happens before the procedure? Medicines Ask your doctor about: Changing or stopping your normal medicines. This   is important. Taking aspirin and ibuprofen. Do not take these medicines unless your doctor tells you to take them. Taking other medicines, vitamins, herbs, and supplements. General instructions Follow instructions from your doctor about what you cannot eat or drink. Plan to have someone take you  home from the hospital or clinic. If you will be going home right after the procedure, plan to have someone with you for 24 hours. Ask your doctor what steps will be taken to prevent infection. What happens during the procedure?  An IV tube will be put into one of your veins. You will be given a medicine to help you relax. The skin on your neck or groin will be numbed. A cut (incision) will be made in your neck or groin. A needle will be put through your cut and into a large vein. A tube (catheter) will be put into the needle. The tube will be moved to your heart. Dye may be put through the tube. This helps your doctor see your heart. Small devices (electrodes) on the tube will send out signals. A type of energy will be used to destroy some heart tissue. The tube will be taken out. Pressure will be held on your cut. This helps stop bleeding. A bandage will be put over your cut. The exact procedure may vary among doctors and hospitals. What happens after the procedure? You will be watched until you leave the hospital or clinic. This includes checking your heart rate, breathing rate, oxygen, and blood pressure. Your cut will be watched for bleeding. You will need to lie still for a few hours. Do not drive for 24 hours or as long as your doctor tells you. Summary Cardiac ablation is a procedure to destroy some heart tissue. This is done to treat heart rhythm problems. Tell your doctor about any medical conditions you may have. Tell him or her about all medicines you are taking to treat them. This is a safe procedure. But problems may occur. These include infection, bruising, bleeding, and damage to nearby areas of your body. Follow what your doctor tells you about food and drink. You may also be told to change or stop some of your medicines. After the procedure, do not drive for 24 hours or as long as your doctor tells you. This information is not intended to replace advice given to you by  your health care provider. Make sure you discuss any questions you have with your health care provider. Document Revised: 11/17/2021 Document Reviewed: 07/30/2019 Elsevier Patient Education  2023 [employer] Inc[employer]   

## 2022-05-30 ENCOUNTER — Encounter: Payer: Self-pay | Admitting: Cardiology

## 2022-06-06 MED ORDER — BISOPROLOL FUMARATE 5 MG PO TABS: 2.5000 mg | ORAL_TABLET | Freq: Every day | ORAL | 1 refills | Status: DC

## 2022-06-06 NOTE — Telephone Encounter (Signed)
Pt has plenty on hand at home, will update medication list but not send in Rx. She will reach out when time to send in refill request.

## 2022-06-11 ENCOUNTER — Other Ambulatory Visit: Payer: Self-pay | Admitting: Cardiology

## 2022-06-26 LAB — BASIC METABOLIC PANEL
BUN/Creatinine Ratio: 17 (ref 12–28)
BUN: 12 mg/dL (ref 8–27)
CO2: 24 mmol/L (ref 20–29)
Calcium: 9.7 mg/dL (ref 8.7–10.3)
Chloride: 103 mmol/L (ref 96–106)
Creatinine, Ser: 0.7 mg/dL (ref 0.57–1.00)
Glucose: 88 mg/dL (ref 70–99)
Potassium: 4.1 mmol/L (ref 3.5–5.2)
Sodium: 142 mmol/L (ref 134–144)
eGFR: 91 mL/min/{1.73_m2} (ref >59)

## 2022-06-26 LAB — CBC
Hematocrit: 42.6 % (ref 34.0–46.6)
Hemoglobin: 14.1 g/dL (ref 11.1–15.9)
MCH: 30 pg (ref 26.6–33.0)
MCHC: 33.1 g/dL (ref 31.5–35.7)
MCV: 91 fL (ref 79–97)
Platelets: 219 10*3/uL (ref 150–450)
RBC: 4.7 x10E6/uL (ref 3.77–5.28)
RDW: 12.7 % (ref 11.7–15.4)
WBC: 7.2 10*3/uL (ref 3.4–10.8)

## 2022-07-06 ENCOUNTER — Telehealth (HOSPITAL_COMMUNITY): Payer: Self-pay | Admitting: Emergency Medicine

## 2022-07-06 NOTE — Telephone Encounter (Signed)
Reaching out to patient to offer assistance regarding upcoming cardiac imaging study; pt verbalizes understanding of appt date/time, parking situation and where to check in, pre-test NPO status and medications ordered, and verified current allergies; name and call back number provided for further questions should they arise Angi Baldo Hufnagle RN Navigator Cardiac Imaging [facility] [phone] office [phone] cell 

## 2022-07-09 ENCOUNTER — Encounter (HOSPITAL_BASED_OUTPATIENT_CLINIC_OR_DEPARTMENT_OTHER): Payer: Self-pay

## 2022-07-09 ENCOUNTER — Ambulatory Visit (HOSPITAL_BASED_OUTPATIENT_CLINIC_OR_DEPARTMENT_OTHER)
Admission: RE | Admit: 2022-07-09 | Discharge: 2022-07-09 | Disposition: A | Discharge status: Home / Self Care | Payer: Medicare Other | Source: Ambulatory Visit | Attending: Cardiology | Admitting: Cardiology

## 2022-07-09 DIAGNOSIS — I480 Paroxysmal atrial fibrillation: Secondary | ICD-10-CM | POA: Diagnosis present

## 2022-07-09 MED ORDER — IOHEXOL 350 MG/ML SOLN
100.0000 mL | Freq: Once | INTRAVENOUS | Status: AC | PRN
  Administered 2022-07-09: 80 mL via INTRAVENOUS

## 2022-07-11 ENCOUNTER — Telehealth: Payer: Self-pay | Admitting: Cardiology

## 2022-07-11 NOTE — Telephone Encounter (Signed)
Pt c/o BP issue: STAT if pt c/o blurred vision, one-sided weakness or slurred speech  1. What are your last 5 BP readings?   Today -  164/94 171/96  2. Are you having any other symptoms (ex. Dizziness, headache, blurred vision, passed out)?   Lightheaded, slightly dizzy  3. What is your BP issue?   Patient stated she is scheduled for an ablation tomorrow and her blood pressure is reading high.

## 2022-07-11 NOTE — Telephone Encounter (Signed)
Patient called in with concerns of elevated blood pressure. 164/94, 171/96. HR in the low 60s. She states she was also experiencing some lightheadedness as well.  Advised that Dr. Curt Bears wants her to take metoprolol 12.5 mg x 1 dose and he will follow up with her at her scheduled procedure on 07/12/22.   Patient verbalized understanding and had no further questions.

## 2022-07-11 NOTE — Anesthesia Preprocedure Evaluation (Signed)
Anesthesia Evaluation  Patient identified by MRN, date of birth, ID band Patient awake    Reviewed: Allergy & Precautions, NPO status , Patient's Chart, lab work & pertinent test results, reviewed documented beta blocker date and time   History of Anesthesia Complications Negative for: history of anesthetic complications  Airway Mallampati: II  TM Distance: >3 FB Neck ROM: Full    Dental  (+) Dental Advisory Given   Pulmonary sleep apnea and Continuous Positive Airway Pressure Ventilation    breath sounds clear to auscultation       Cardiovascular hypertension, Pt. on medications and Pt. on home beta blockers (-) angina + dysrhythmias Atrial Fibrillation  Rhythm:Irregular Rate:Normal  11/2021 ECHO: EF 60-65%, normal LVF, normal RVF, dilated LA, mild MR   Neuro/Psych    Depression    negative neurological ROS     GI/Hepatic negative GI ROS, Neg liver ROS,,,  Endo/Other  negative endocrine ROS    Renal/GU negative Renal ROS     Musculoskeletal  (+) Arthritis ,    Abdominal   Peds  Hematology Eliquis   Anesthesia Other Findings Breast cancer  Reproductive/Obstetrics                              Anesthesia Physical Anesthesia Plan  ASA: 3  Anesthesia Plan: General   Post-op Pain Management: Tylenol PO (pre-op)*   Induction: Intravenous  PONV Risk Score and Plan: 3 and Ondansetron, Dexamethasone and Treatment may vary due to age or medical condition  Airway Management Planned: Oral ETT  Additional Equipment: None  Intra-op Plan:   Post-operative Plan: Extubation in OR  Informed Consent: I have reviewed the patients History and Physical, chart, labs and discussed the procedure including the risks, benefits and alternatives for the proposed anesthesia with the patient or authorized representative who has indicated his/her understanding and acceptance.     Dental advisory  given  Plan Discussed with: CRNA and Surgeon  Anesthesia Plan Comments:          Anesthesia Quick Evaluation

## 2022-07-11 NOTE — Pre-Procedure Instructions (Signed)
Attempted to call patient regarding procedure instructions.  Left voicemail on the following items: Arrival time 0530 Nothing to eat or drink after midnight No meds AM of procedure Responsible person to drive you home and stay with you for 24 hrs  Have you missed any doses of anti-coagulant Eliquis- if you have missed any doses please let the office know.

## 2022-07-12 ENCOUNTER — Ambulatory Visit (HOSPITAL_COMMUNITY): Payer: Medicare Other | Admitting: Anesthesiology

## 2022-07-12 ENCOUNTER — Ambulatory Visit (HOSPITAL_BASED_OUTPATIENT_CLINIC_OR_DEPARTMENT_OTHER): Payer: Medicare Other | Admitting: Anesthesiology

## 2022-07-12 ENCOUNTER — Other Ambulatory Visit: Payer: Self-pay

## 2022-07-12 ENCOUNTER — Encounter (HOSPITAL_COMMUNITY): Payer: Self-pay | Admitting: Cardiology

## 2022-07-12 ENCOUNTER — Ambulatory Visit (HOSPITAL_COMMUNITY)
Admission: RE | Admit: 2022-07-12 | Discharge: 2022-07-12 | Disposition: A | Discharge status: Home / Self Care | Payer: Medicare Other | Attending: Cardiology | Admitting: Cardiology

## 2022-07-12 ENCOUNTER — Encounter (HOSPITAL_COMMUNITY)
Admission: RE | Disposition: A | Discharge status: Home / Self Care | Payer: Self-pay | Source: Home / Self Care | Attending: Cardiology

## 2022-07-12 DIAGNOSIS — I4891 Unspecified atrial fibrillation: Secondary | ICD-10-CM

## 2022-07-12 DIAGNOSIS — I10 Essential (primary) hypertension: Secondary | ICD-10-CM

## 2022-07-12 DIAGNOSIS — Z9989 Dependence on other enabling machines and devices: Secondary | ICD-10-CM

## 2022-07-12 DIAGNOSIS — G4730 Sleep apnea, unspecified: Secondary | ICD-10-CM | POA: Insufficient documentation

## 2022-07-12 DIAGNOSIS — E785 Hyperlipidemia, unspecified: Secondary | ICD-10-CM | POA: Diagnosis not present

## 2022-07-12 DIAGNOSIS — G4733 Obstructive sleep apnea (adult) (pediatric): Secondary | ICD-10-CM

## 2022-07-12 DIAGNOSIS — I480 Paroxysmal atrial fibrillation: Secondary | ICD-10-CM | POA: Diagnosis not present

## 2022-07-12 DIAGNOSIS — Z853 Personal history of malignant neoplasm of breast: Secondary | ICD-10-CM | POA: Diagnosis not present

## 2022-07-12 HISTORY — PX: ATRIAL FIBRILLATION ABLATION: EP1191

## 2022-07-12 LAB — POCT ACTIVATED CLOTTING TIME: Activated Clotting Time: 431 seconds

## 2022-07-12 SURGERY — ATRIAL FIBRILLATION ABLATION
Anesthesia: General

## 2022-07-12 MED ORDER — EPHEDRINE SULFATE-NACL 50-0.9 MG/10ML-% IV SOSY
PREFILLED_SYRINGE | INTRAVENOUS | Status: DC | PRN
  Administered 2022-07-12 (×5): 5 mg via INTRAVENOUS

## 2022-07-12 MED ORDER — ROCURONIUM BROMIDE 100 MG/10ML IV SOLN
INTRAVENOUS | Status: DC | PRN
  Administered 2022-07-12: 60 mg via INTRAVENOUS

## 2022-07-12 MED ORDER — SODIUM CHLORIDE 0.9% FLUSH: 3.0000 mL | INTRAVENOUS | Status: DC | PRN

## 2022-07-12 MED ORDER — ACETAMINOPHEN 500 MG PO TABS
1000.0000 mg | ORAL_TABLET | Freq: Once | ORAL | Status: AC
  Administered 2022-07-12: 1000 mg via ORAL
  Filled 2022-07-12: qty 2

## 2022-07-12 MED ORDER — DEXAMETHASONE SODIUM PHOSPHATE 10 MG/ML IJ SOLN
INTRAMUSCULAR | Status: DC | PRN
  Administered 2022-07-12: 5 mg via INTRAVENOUS

## 2022-07-12 MED ORDER — PHENYLEPHRINE 80 MCG/ML (10ML) SYRINGE FOR IV PUSH (FOR BLOOD PRESSURE SUPPORT)
PREFILLED_SYRINGE | INTRAVENOUS | Status: DC | PRN
  Administered 2022-07-12: 80 ug via INTRAVENOUS

## 2022-07-12 MED ORDER — HEPARIN SODIUM (PORCINE) 1000 UNIT/ML IJ SOLN
INTRAMUSCULAR | Status: AC
  Filled 2022-07-12: qty 10

## 2022-07-12 MED ORDER — HEPARIN SODIUM (PORCINE) 1000 UNIT/ML IJ SOLN
INTRAMUSCULAR | Status: DC | PRN
  Administered 2022-07-12: 1000 [IU] via INTRAVENOUS

## 2022-07-12 MED ORDER — SODIUM CHLORIDE 0.9 % IV SOLN: INTRAVENOUS | Status: DC

## 2022-07-12 MED ORDER — HEPARIN (PORCINE) IN NACL 1000-0.9 UT/500ML-% IV SOLN
INTRAVENOUS | Status: AC
  Filled 2022-07-12: qty 500

## 2022-07-12 MED ORDER — PROPOFOL 10 MG/ML IV BOLUS
INTRAVENOUS | Status: DC | PRN
  Administered 2022-07-12: 100 mg via INTRAVENOUS
  Administered 2022-07-12 (×2): 10 mg via INTRAVENOUS

## 2022-07-12 MED ORDER — SODIUM CHLORIDE 0.9% FLUSH: 3.0000 mL | Freq: Two times a day (BID) | INTRAVENOUS | Status: DC

## 2022-07-12 MED ORDER — FENTANYL CITRATE (PF) 100 MCG/2ML IJ SOLN
INTRAMUSCULAR | Status: DC | PRN
  Administered 2022-07-12 (×2): 50 ug via INTRAVENOUS

## 2022-07-12 MED ORDER — ONDANSETRON HCL 4 MG/2ML IJ SOLN: 4.0000 mg | Freq: Four times a day (QID) | INTRAMUSCULAR | Status: DC | PRN

## 2022-07-12 MED ORDER — HEPARIN (PORCINE) IN NACL 1000-0.9 UT/500ML-% IV SOLN
INTRAVENOUS | Status: DC | PRN
  Administered 2022-07-12 (×4): 500 mL

## 2022-07-12 MED ORDER — PROTAMINE SULFATE 10 MG/ML IV SOLN
INTRAVENOUS | Status: DC | PRN
  Administered 2022-07-12: 40 mg via INTRAVENOUS

## 2022-07-12 MED ORDER — MIDAZOLAM HCL 5 MG/5ML IJ SOLN
INTRAMUSCULAR | Status: DC | PRN
  Administered 2022-07-12: 1 mg via INTRAVENOUS

## 2022-07-12 MED ORDER — DOBUTAMINE INFUSION FOR EP/ECHO/NUC (1000 MCG/ML)
INTRAVENOUS | Status: DC | PRN
  Administered 2022-07-12: 20 ug/kg/min via INTRAVENOUS

## 2022-07-12 MED ORDER — HEPARIN SODIUM (PORCINE) 1000 UNIT/ML IJ SOLN
INTRAMUSCULAR | Status: DC | PRN
  Administered 2022-07-12: 13000 [IU] via INTRAVENOUS

## 2022-07-12 MED ORDER — ACETAMINOPHEN 325 MG PO TABS: 650.0000 mg | ORAL_TABLET | ORAL | Status: DC | PRN

## 2022-07-12 MED ORDER — SODIUM CHLORIDE 0.9 % IV SOLN: 250.0000 mL | INTRAVENOUS | Status: DC | PRN

## 2022-07-12 MED ORDER — DOBUTAMINE INFUSION FOR EP/ECHO/NUC (1000 MCG/ML)
INTRAVENOUS | Status: AC
  Filled 2022-07-12: qty 250

## 2022-07-12 MED ORDER — SUGAMMADEX SODIUM 200 MG/2ML IV SOLN
INTRAVENOUS | Status: DC | PRN
  Administered 2022-07-12: 200 mg via INTRAVENOUS

## 2022-07-12 MED ORDER — LIDOCAINE 2% (20 MG/ML) 5 ML SYRINGE
INTRAMUSCULAR | Status: DC | PRN
  Administered 2022-07-12: 20 mg via INTRAVENOUS

## 2022-07-12 MED ORDER — ONDANSETRON HCL 4 MG/2ML IJ SOLN
INTRAMUSCULAR | Status: DC | PRN
  Administered 2022-07-12: 4 mg via INTRAVENOUS

## 2022-07-12 SURGICAL SUPPLY — 20 items
BAG SNAP BAND KOVER 36X36 (MISCELLANEOUS) IMPLANT
CATH 8FR REPROCESSED SOUNDSTAR (CATHETERS) ×1 IMPLANT
CATH 8FR SOUNDSTAR REPROCESSED (CATHETERS) IMPLANT
CATH ABLAT QDOT MICRO BI TC DF (CATHETERS) IMPLANT
CATH OCTARAY 2.0 F 3-3-3-3-3 (CATHETERS) IMPLANT
CATH PIGTAIL STEERABLE D1 8.7 (WIRE) IMPLANT
CATH S-M CIRCA TEMP PROBE (CATHETERS) IMPLANT
CATH WEBSTER BI DIR CS D-F CRV (CATHETERS) IMPLANT
CLOSURE PERCLOSE PROSTYLE (VASCULAR PRODUCTS) IMPLANT
COVER SWIFTLINK CONNECTOR (BAG) ×1 IMPLANT
PACK EP LATEX FREE (CUSTOM PROCEDURE TRAY) ×1
PACK EP LF (CUSTOM PROCEDURE TRAY) ×1 IMPLANT
PAD DEFIB RADIO PHYSIO CONN (PAD) ×1 IMPLANT
PATCH CARTO3 (PAD) IMPLANT
SHEATH CARTO VIZIGO SM CVD (SHEATH) IMPLANT
SHEATH PINNACLE 7F 10CM (SHEATH) IMPLANT
SHEATH PINNACLE 8F 10CM (SHEATH) IMPLANT
SHEATH PINNACLE 9F 10CM (SHEATH) IMPLANT
SHEATH PROBE COVER 6X72 (BAG) IMPLANT
TUBING SMART ABLATE COOLFLOW (TUBING) IMPLANT

## 2022-07-12 NOTE — Anesthesia Postprocedure Evaluation (Signed)
Anesthesia Post Note  Patient: Nancy Oconnell  Procedure(s) Performed: ATRIAL FIBRILLATION ABLATION     Patient location during evaluation: Cath Lab Anesthesia Type: General Level of consciousness: awake and alert, patient cooperative and oriented Pain management: pain level controlled Vital Signs Assessment: post-procedure vital signs reviewed and stable Respiratory status: nonlabored ventilation, spontaneous breathing and respiratory function stable Cardiovascular status: stable and blood pressure returned to baseline Postop Assessment: no apparent nausea or vomiting Anesthetic complications: no   There were no known notable events for this encounter.  Last Vitals:  Vitals:   07/12/22 0930 07/12/22 0940  BP: 132/61 (!) 132/57  Pulse: 73 72  Resp: 17 18  Temp:  (!) 36.3 C  SpO2: 97% 100%    Last Pain:  Vitals:   07/12/22 1001  TempSrc:   PainSc: 0-No pain                 Azhar Yogi,E. Wilsie Kern

## 2022-07-12 NOTE — Discharge Instructions (Signed)

## 2022-07-12 NOTE — Transfer of Care (Signed)
Immediate Anesthesia Transfer of Care Note  Patient: Cherry Turlington  Procedure(s) Performed: ATRIAL FIBRILLATION ABLATION  Patient Location: PACU  Anesthesia Type:General  Level of Consciousness: drowsy and patient cooperative  Airway & Oxygen Therapy: Patient Spontanous Breathing  Post-op Assessment: Report given to RN and Post -op Vital signs reviewed and stable  Post vital signs: Reviewed and stable  Last Vitals:  Vitals Value Taken Time  BP 138/55 07/12/22 0911  Temp 36.2 C 07/12/22 0910  Pulse 78 07/12/22 0914  Resp 18 07/12/22 0914  SpO2 96 % 07/12/22 0914  Vitals shown include unvalidated device data.  Last Pain:  Vitals:   07/12/22 0910  TempSrc: Temporal  PainSc: 0-No pain         Complications: There were no known notable events for this encounter.
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
## 2022-07-12 NOTE — Anesthesia Procedure Notes (Signed)
Procedure Name: Intubation Date/Time: 07/12/2022 7:52 AM  Performed by: Gwyndolyn Saxon, CRNAPre-anesthesia Checklist: Patient identified, Emergency Drugs available, Suction available and Patient being monitored Patient Re-evaluated:Patient Re-evaluated prior to induction Oxygen Delivery Method: Circle system utilized Preoxygenation: Pre-oxygenation with 100% oxygen Induction Type: IV induction Ventilation: Mask ventilation without difficulty Laryngoscope Size: Miller and 2 Grade View: Grade I Tube type: Oral Tube size: 7.0 mm Number of attempts: 1 Airway Equipment and Method: Stylet Placement Confirmation: ETT inserted through vocal cords under direct vision, positive ETCO2 and breath sounds checked- equal and bilateral Secured at: 20 cm Tube secured with: Tape Dental Injury: Teeth and Oropharynx as per pre-operative assessment

## 2022-07-12 NOTE — Progress Notes (Signed)
post ambulation done, pt tolerated well. No s/s of bleeding or a hematoma noted. will continue to monitor.

## 2022-07-21 ENCOUNTER — Encounter: Payer: Self-pay | Admitting: Cardiology

## 2022-07-26 ENCOUNTER — Other Ambulatory Visit: Payer: Self-pay | Admitting: Cardiology

## 2022-07-26 DIAGNOSIS — I480 Paroxysmal atrial fibrillation: Secondary | ICD-10-CM

## 2022-07-26 NOTE — Telephone Encounter (Signed)
Prescription refill request for Eliquis received. Indication: Afib  Last office visit: 05/16/22 ([place])  Scr: 0.70 (06/25/22) Age: 76 Weight: 58.1kg  Appropriate dose and refill sent to requested pharmacy.

## 2022-08-01 ENCOUNTER — Other Ambulatory Visit: Payer: Self-pay | Admitting: *Deleted

## 2022-08-01 DIAGNOSIS — I480 Paroxysmal atrial fibrillation: Secondary | ICD-10-CM

## 2022-08-01 MED ORDER — APIXABAN 5 MG PO TABS: 5.0000 mg | ORAL_TABLET | Freq: Two times a day (BID) | ORAL | 5 refills | Status: DC

## 2022-08-01 NOTE — Telephone Encounter (Signed)
Prescription refill request for Eliquis received. Indication: PAF Last office visit: 05/16/22  Elliot Cousin MD Scr: 0.70 on 06/25/22 Age: 76 Weight: 58.3kg  Based on above findings Eliquis '5mg'$  twice daily is the appropriate dose.  Refill approved.

## 2022-08-06 ENCOUNTER — Ambulatory Visit: Payer: Medicare Other | Attending: Cardiovascular Disease | Admitting: Cardiovascular Disease

## 2022-08-06 ENCOUNTER — Encounter: Payer: Self-pay | Admitting: Cardiovascular Disease

## 2022-08-06 VITALS — BP 122/62 | HR 64 | Ht 63.0 in | Wt 129.8 lb

## 2022-08-06 DIAGNOSIS — G4733 Obstructive sleep apnea (adult) (pediatric): Secondary | ICD-10-CM | POA: Diagnosis not present

## 2022-08-06 NOTE — Progress Notes (Signed)
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 airflow, thoracic and abdominal wall motion, anterior tibialis EMG, snore microphone, electrocardiogram, and pulse oximetry. Continuous positive airway pressure (CPAP) was initiated at the beginning of the study and titrated to treat sleep-disordered breathing.   MEDICATIONS acetaminophen (TYLENOL) 500 MG tablet apixaban (ELIQUIS) 5 MG TABS tablet bisoprolol (ZEBETA) 5 MG tablet estradiol (ESTRACE) 0.1 MG/GM vaginal cream flecainide (TAMBOCOR) 50 MG tablet linaclotide (LINZESS) 145 MCG CAPS capsule mirtazapine (REMERON) 15 MG tablet nitrofurantoin (MACRODANTIN) 50 MG capsule tamoxifen (NOLVADEX) 20 MG tablet Medications self-administered by patient taken the night of the study : REMERON   TECHNICIAN COMMENTS Comments added by  technician: PATIENT WAS ORDERED AS A CPAP TITRATION. Comments added by scorer: N/A   RESPIRATORY PARAMETERS Optimal PAP Pressure (cm):  12        AHI at Optimal Pressure (/hr):            0.0 Overall Minimal O2 (%):         92.0     Supine % at Optimal Pressure (%):    2 Minimal O2 at Optimal Pressure (%): 95.0        SLEEP ARCHITECTURE The study was initiated at 10:45:42 PM and ended at 4:45:04 AM.   Sleep onset time was 11.6 minutes and the sleep efficiency was 51.9%%. The total sleep time was 186.5 minutes.   The patient spent 13.7%% of the night in stage N1 sleep, 74.8%% in stage N2 sleep, 0.0%% in stage N3 and 11.5% in REM.Stage REM latency was 167.5 minutes   Wake after sleep onset was 161.3. Alpha intrusion was absent. Supine sleep was 0.27%.   CARDIAC DATA The 2 lead EKG demonstrated sinus rhythm. The mean heart rate was 56.6 beats per minute. Other EKG findings include: None.   LEG MOVEMENT DATA The total Periodic Limb Movements of Sleep (PLMS) were 0. The PLMS index was 0.0. A PLMS index of <15 is considered normal in adults.   IMPRESSIONS - CPAP was initiated at 6 cm and was titrated to optimal PAP pressure at 12 cm of water; AHI 0, RDI 3.8/h, O2 nadir 95%. - Central sleep apnea was not noted during this titration (CAI = 0.0/h). - Significant oxygen desaturations were not observed during this titration (min O2 = 92.0%). - The patient snored with moderate snoring volume during this titration study. - No significant cardiac abnormalities were observed during this study; rare PAC - Clinically significant periodic limb movements were not noted during this study. Arousals associated with PLMs were significant.   DIAGNOSIS - Obstructive Sleep Apnea (G47.33) - Periodic Limb Movement During Sleep (G47.61)   RECOMMENDATIONS - Recommend an initial trial of CPAP therapy at12 cm H2O with heated humidification.  A small size Resmed Full Face Mask Mirage Quattro mask was used for the  titration study. - Efforet should be made to optimize nasal and oropharyngeal patency. - Avoid alcohol, sedatives and other CNS depressants that may worsen sleep apnea and disrupt normal sleep architecture. - Sleep hygiene should be reviewed to assess factors that may improve sleep quality. - Weight management and regular exercise should be initiated or continued. - Recommend a download in 30 days  and sleep clinic evaluation after 4 weeks of therapy     A download was obtained from September 03, 2020 through October 02, 2020 as noted above.  A new download was obtained from October 23 through July 31, 2022.   ASSESSMENT:    No diagnosis found.   PLAN:  Ms Raianna Slight is a 76 year old female patient who is followed by Dr. Glenetta Hew for primary cardiology care.  She has a history of paroxysmal atrial fibrillation and is on bisoprolol and flecainide in addition to apixaban anticoagulation for her PAF.  In 2002 she underwent a sleep evaluation at [facility] which demonstrated mild sleep apnea with an RDI of 9 and during REM sleep AHI was 14 events per hour.  Her O2 nadir was 89%.  There was moderate snoring and she had moderate periodic leg movements with an increased PLMS arousal index.  During that time she had delayed REM sleep latency at 247 minutes.  She was not placed on therapy.  Recently with her cardiovascular comorbidities subsequent evaluation disclosed at least mild sleep apnea.  She has been on CPAP therapy since July 21, 2020.  During her initial evaluation I discussed normal sleep architecture and the disruptive sleep architecture associated with obstructive sleep apnea.  I reviewed in detail the effect on cardiovascular health if patients have untreated sleep apnea with particular reference to its effects on hypertension, nocturnal arrhythmias with increased sympathetic tone contributing to palpitations as well as increased atrial fibrillation risk.  I discussed  its effects on insulin resistance contributing to increased glucose, GERD, as well as potential increased inflammation.  In addition we discussed nocturnal hypoxemia at contributing potentially to nocturnal ischemia both cardiac as well as cerebrovascular.  I also discussed the pathophysiology associated with frequent nocturia seen in patients with untreated sleep apnea and the benefit of therapy.  Presently, she has continued to be compliant with CPAP therapy.  She typically goes to bed at 10 PM and wakes up at 7 AM.  At her current pressure setting range at 8 to 15 cm of water, AHI is 3.6/h and her 95th percentile average pressure is 11.1 with maximum average pressure 12.2 cm of water.  Compliance was excellent however she did not take her CPAP device with her when traveling to [place].  I suggest that in future travel experiences that she takes CPAP with her so that she will be able to have more optimal sleep and feeling more refreshed on her vacation.  Her nocturia has improved to 1-2 times per night.  Her blood pressure today is stable and she is maintaining sinus rhythm.  Continues to be on bisoprolol and flecainide and has a prescription for as needed metoprolol which she has not needed.  She is on omega-3 fatty acid and rosuvastatin for hyperlipidemia.  She is anticoagulated on Eliquis.  She returned to the primary cardiology care of Dr. Ellyn Hack.  I will see her in 1 year for reevaluation or sooner as needed.   Medication Adjustments/Labs and Tests Ordered: Current medicines are reviewed at length with the patient today.  Concerns regarding medicines are outlined above.  Medication changes, Labs and Tests ordered today are listed in the Patient Instructions below. There are no Patient Instructions on file for this visit.   Signed, Shelva Majestic, MD  08/06/2022 11:43 AM    [facility] [address] [place], [place], [place]  [address] Phone: [phone]

## 2022-08-06 NOTE — Patient Instructions (Signed)
Medication Instructions:  The current medical regimen is effective;  continue present plan and medications.  *If you need a refill on your cardiac medications before your next appointment, please call your pharmacy*   Follow-Up: At [facility], you and your health needs are our priority.  As part of our continuing mission to provide you with exceptional heart care, we have created designated Provider Care Teams.  These Care Teams include your primary Cardiologist (physician) and Advanced Practice Providers (APPs -  Physician Assistants and Nurse Practitioners) who all work together to provide you with the care you need, when you need it.  We recommend signing up for the patient portal called "MyChart".  Sign up information is provided on this After Visit Summary.  MyChart is used to connect with patients for Virtual Visits (Telemedicine).  Patients are able to view lab/test results, encounter notes, upcoming appointments, etc.  Non-urgent messages can be sent to your provider as well.   To learn more about what you can do with MyChart, go to [email or web address].    Your next appointment:   12 month(s)  The format for your next appointment:   In Person  Provider:   Shelva Majestic, MD (sleep)

## 2022-08-08 ENCOUNTER — Ambulatory Visit (HOSPITAL_COMMUNITY): Payer: Medicare Other | Admitting: Physician Assistant

## 2022-08-08 ENCOUNTER — Encounter (HOSPITAL_COMMUNITY): Payer: Self-pay

## 2022-08-09 ENCOUNTER — Ambulatory Visit (HOSPITAL_COMMUNITY): Payer: Medicare Other | Admitting: Physician Assistant

## 2022-08-14 ENCOUNTER — Other Ambulatory Visit: Payer: Self-pay | Admitting: *Deleted

## 2022-08-14 DIAGNOSIS — I480 Paroxysmal atrial fibrillation: Secondary | ICD-10-CM

## 2022-08-17 ENCOUNTER — Encounter: Payer: Self-pay | Admitting: Cardiovascular Disease

## 2022-08-17 ENCOUNTER — Ambulatory Visit (INDEPENDENT_AMBULATORY_CARE_PROVIDER_SITE_OTHER): Payer: Medicare Other | Admitting: Internal Medicine

## 2022-08-17 ENCOUNTER — Other Ambulatory Visit: Payer: Self-pay | Admitting: Cardiology

## 2022-08-17 DIAGNOSIS — I480 Paroxysmal atrial fibrillation: Secondary | ICD-10-CM

## 2022-08-17 LAB — PULMONARY FUNCTION TEST
DL/VA % pred: 117 %
DL/VA: 4.87 ml/min/mmHg/L
DLCO cor % pred: 100 %
DLCO cor: 18.61 ml/min/mmHg
DLCO unc % pred: 100 %
DLCO unc: 18.61 ml/min/mmHg
FEF 25-75 Post: 2.14 L/sec
FEF 25-75 Pre: 1.68 L/sec
FEF2575-%Change-Post: 27 %
FEF2575-%Pred-Post: 129 %
FEF2575-%Pred-Pre: 101 %
FEV1-%Change-Post: 5 %
FEV1-%Pred-Post: 89 %
FEV1-%Pred-Pre: 84 %
FEV1-Post: 1.82 L
FEV1-Pre: 1.73 L
FEV1FVC-%Change-Post: 5 %
FEV1FVC-%Pred-Pre: 104 %
FEV6-%Change-Post: 0 %
FEV6-%Pred-Post: 84 %
FEV6-%Pred-Pre: 84 %
FEV6-Post: 2.2 L
FEV6-Pre: 2.19 L
FEV6FVC-%Pred-Post: 105 %
FEV6FVC-%Pred-Pre: 105 %
FVC-%Change-Post: 0 %
FVC-%Pred-Post: 80 %
FVC-%Pred-Pre: 80 %
FVC-Post: 2.2 L
FVC-Pre: 2.19 L
Post FEV1/FVC ratio: 83 %
Post FEV6/FVC ratio: 100 %
Pre FEV1/FVC ratio: 79 %
Pre FEV6/FVC Ratio: 100 %
RV % pred: 80 %
RV: 1.8 L
TLC % pred: 83 %
TLC: 4.07 L

## 2022-08-17 NOTE — Progress Notes (Signed)
Full PFT performed today. °

## 2022-08-17 NOTE — Patient Instructions (Signed)
Full PFT performed today. °

## 2022-08-20 MED ORDER — APIXABAN 5 MG PO TABS: 5.0000 mg | ORAL_TABLET | Freq: Two times a day (BID) | ORAL | 0 refills | Status: DC

## 2022-08-20 NOTE — Telephone Encounter (Signed)
Sample sent in as requested. Pt appreciates the help

## 2022-08-23 ENCOUNTER — Other Ambulatory Visit: Payer: Self-pay | Admitting: Cardiology

## 2022-10-11 ENCOUNTER — Encounter: Payer: Self-pay | Admitting: Cardiology

## 2022-10-11 ENCOUNTER — Ambulatory Visit: Payer: Medicare Other | Attending: Cardiology | Admitting: Cardiology

## 2022-10-11 VITALS — BP 136/82 | HR 62 | Ht 63.0 in | Wt 127.0 lb

## 2022-10-11 DIAGNOSIS — D6869 Other thrombophilia: Secondary | ICD-10-CM | POA: Insufficient documentation

## 2022-10-11 DIAGNOSIS — I480 Paroxysmal atrial fibrillation: Secondary | ICD-10-CM

## 2022-10-11 NOTE — Patient Instructions (Addendum)
Medication Instructions:  Your physician has recommended you make the following change in your medication: STOP Flecainide  *If you need a refill on your cardiac medications before your next appointment, please call your pharmacy*   Lab Work: None ordered   Testing/Procedures: None ordered   Follow-Up: At [facility], you and your health needs are our priority.  As part of our continuing mission to provide you with exceptional heart care, we have created designated Provider Care Teams.  These Care Teams include your primary Cardiologist (physician) and Advanced Practice Providers (APPs -  Physician Assistants and Nurse Practitioners) who all work together to provide you with the care you need, when you need it.  Your next appointment:   6 month(s)  The format for your next appointment:   In Person  Provider:   Allegra Lai, MD    Thank you for choosing [facility]!!   Trinidad Curet, RN [phone]
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
## 2022-10-18 ENCOUNTER — Encounter (HOSPITAL_COMMUNITY): Payer: Self-pay | Admitting: *Deleted

## 2023-01-18 ENCOUNTER — Other Ambulatory Visit: Payer: Self-pay | Admitting: Cardiology

## 2023-01-18 NOTE — Telephone Encounter (Signed)
Prescription refill request for Eliquis received.  Indication: afib  Last office visit: [place], 10/11/2022 Scr:  0.70, 06/25/2022 Age: 77 yo  Weight: 57.6 kg   Refill sent.

## 2023-01-21 ENCOUNTER — Ambulatory Visit: Payer: Medicare Other | Admitting: Cardiology

## 2023-02-16 ENCOUNTER — Encounter: Payer: Self-pay | Admitting: Cardiology

## 2023-02-19 MED ORDER — APIXABAN 5 MG PO TABS: 5.0000 mg | ORAL_TABLET | Freq: Two times a day (BID) | ORAL | 5 refills | Status: DC

## 2023-04-16 ENCOUNTER — Encounter: Payer: Self-pay | Admitting: Cardiology

## 2023-04-16 ENCOUNTER — Ambulatory Visit: Payer: Medicare Other | Attending: Cardiology | Admitting: Cardiology

## 2023-04-16 VITALS — BP 118/78 | HR 55 | Ht 63.0 in | Wt 125.2 lb

## 2023-04-16 DIAGNOSIS — I480 Paroxysmal atrial fibrillation: Secondary | ICD-10-CM | POA: Insufficient documentation

## 2023-04-16 DIAGNOSIS — Z79899 Other long term (current) drug therapy: Secondary | ICD-10-CM | POA: Insufficient documentation

## 2023-04-16 NOTE — Progress Notes (Signed)
  Electrophysiology Office Note:   Date:  04/16/2023  ID:  Nancy Oconnell, DOB [birth year], MRN [id number]  Primary Cardiologist: Bryan Lemma, MD Electrophysiologist: Liridona Mashaw Jorja Loa, MD      History of Present Illness:   Nancy Oconnell is a 77 y.o. female with h/o real fibrillation seen today for routine electrophysiology followup.  Since last being seen in our clinic the patient reports doing well.  She is post ablation x 2, most recently November 2023.  Since her last ablation, she has had no further episodes of atrial fibrillation.  She remains quite active.  She is able to do her daily activities without restriction.  she denies chest pain, palpitations, dyspnea, PND, orthopnea, nausea, vomiting, dizziness, syncope, edema, weight gain, or early satiety.   Review of systems complete and found to be negative unless listed in HPI.   EP Information / Studies Reviewed:    EKG is ordered today. Personal review as below.  EKG Interpretation Date/Time:  Tuesday April 16 2023 11:55:05 EDT Ventricular Rate:  55 PR Interval:  168 QRS Duration:  80 QT Interval:  426 QTC Calculation: 407 R Axis:   78  Text Interpretation: Sinus bradycardia with sinus arrhythmia When compared with ECG of 12-Jul-2022 09:16, PR interval has decreased QT has shortened Confirmed by Cheyann Blecha ([id number]) on 04/16/2023 12:07:07 PM     Risk Assessment/Calculations:    CHA2DS2-VASc Score = 4   This indicates a 4.8% annual risk of stroke. The patient's score is based upon: CHF History: 0 HTN History: 1 Diabetes History: 0 Stroke History: 0 Vascular Disease History: 0 Age Score: 2 Gender Score: 1             Physical Exam:   VS:  BP 118/78 (BP Location: Right Arm, Patient Position: Sitting, Cuff Size: Normal)   Pulse (!) 55   Ht 5\' 3"  (1.6 m)   Wt 125 lb 3.2 oz (56.8 kg)   SpO2 97%   BMI 22.18 kg/m    Wt Readings from Last 3 Encounters:  04/16/23 125 lb 3.2 oz (56.8 kg)  10/11/22 127 lb (57.6 kg)   08/06/22 129 lb 12.8 oz (58.9 kg)     GEN: Well nourished, well developed in no acute distress NECK: No JVD; No carotid bruits CARDIAC: Regular rate and rhythm, no murmurs, rubs, gallops RESPIRATORY:  Clear to auscultation without rales, wheezing or rhonchi  ABDOMEN: Soft, non-tender, non-distended EXTREMITIES:  No edema; No deformity   ASSESSMENT AND PLAN:    1..  Paroxysmal atrial fibrillation: Currently on Eliquis.  Status post ablation 09/19/2021 with repeat ablation 07/12/2022.  No further episodes of atrial fibrillation.  Nancy Oconnell continue with current management.  2.  Secondary hypercoagulable date: Currently on Eliquis for atrial fibrillation  Follow up with Afib Clinic in 6 months  Signed, Helio Lack Jorja Loa, MD

## 2023-04-16 NOTE — Patient Instructions (Signed)
Medication Instructions:  Your physician recommends that you continue on your current medications as directed. Please refer to the Current Medication list given to you today.  *If you need a refill on your cardiac medications before your next appointment, please call your pharmacy*   Lab Work: None ordered   Testing/Procedures: None ordered   Follow-Up: At [facility], you and your health needs are our priority.  As part of our continuing mission to provide you with exceptional heart care, we have created designated Provider Care Teams.  These Care Teams include your primary Cardiologist (physician) and Advanced Practice Providers (APPs -  Physician Assistants and Nurse Practitioners) who all work together to provide you with the care you need, when you need it.  Your next appointment:   6 month(s)  The format for your next appointment:   In Person  Provider:   You will follow up in the Atrial Fibrillation Clinic located at [facility]. Your provider will be: Clint R. Fenton, PA-C  Or  Jon Suarez, PA  Thank you for choosing [facility]!!    Sherri Price, RN [phone]          

## 2023-05-27 ENCOUNTER — Other Ambulatory Visit: Payer: Self-pay | Admitting: Cardiology

## 2023-10-07 ENCOUNTER — Encounter: Payer: Self-pay | Admitting: Cardiology

## 2023-10-08 ENCOUNTER — Encounter (HOSPITAL_COMMUNITY): Payer: Self-pay | Admitting: Physician Assistant

## 2023-10-08 ENCOUNTER — Ambulatory Visit (HOSPITAL_COMMUNITY)
Admission: RE | Admit: 2023-10-08 | Discharge: 2023-10-08 | Disposition: A | Discharge status: Home / Self Care | Payer: Medicare Other | Source: Ambulatory Visit | Attending: Physician Assistant | Admitting: Physician Assistant

## 2023-10-08 VITALS — BP 142/92 | HR 128 | Ht 63.0 in | Wt 127.2 lb

## 2023-10-08 DIAGNOSIS — I10 Essential (primary) hypertension: Secondary | ICD-10-CM | POA: Diagnosis not present

## 2023-10-08 DIAGNOSIS — D6869 Other thrombophilia: Secondary | ICD-10-CM | POA: Insufficient documentation

## 2023-10-08 DIAGNOSIS — E785 Hyperlipidemia, unspecified: Secondary | ICD-10-CM | POA: Diagnosis not present

## 2023-10-08 DIAGNOSIS — Z7901 Long term (current) use of anticoagulants: Secondary | ICD-10-CM | POA: Insufficient documentation

## 2023-10-08 DIAGNOSIS — I480 Paroxysmal atrial fibrillation: Secondary | ICD-10-CM | POA: Insufficient documentation

## 2023-10-08 DIAGNOSIS — G4733 Obstructive sleep apnea (adult) (pediatric): Secondary | ICD-10-CM | POA: Insufficient documentation

## 2023-10-08 DIAGNOSIS — Z79899 Other long term (current) drug therapy: Secondary | ICD-10-CM | POA: Insufficient documentation

## 2023-10-08 MED ORDER — FLECAINIDE ACETATE 100 MG PO TABS: 100.0000 mg | ORAL_TABLET | Freq: Two times a day (BID) | ORAL | 1 refills | Status: DC

## 2023-10-08 NOTE — Patient Instructions (Signed)
Start Flecainide 100 mg- Taking 1 tablet by mouth twice daily
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
## 2023-10-16 ENCOUNTER — Other Ambulatory Visit: Payer: Self-pay

## 2023-10-16 ENCOUNTER — Ambulatory Visit: Payer: Medicare Other | Attending: Sports Medicine | Admitting: Physical Therapy

## 2023-10-16 DIAGNOSIS — M25551 Pain in right hip: Secondary | ICD-10-CM

## 2023-10-16 DIAGNOSIS — M6281 Muscle weakness (generalized): Secondary | ICD-10-CM | POA: Diagnosis not present

## 2023-10-16 DIAGNOSIS — R262 Difficulty in walking, not elsewhere classified: Secondary | ICD-10-CM
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
## 2023-10-17 ENCOUNTER — Ambulatory Visit (HOSPITAL_COMMUNITY): Payer: Medicare Other | Admitting: Physician Assistant

## 2023-10-22 ENCOUNTER — Ambulatory Visit: Payer: Medicare Other

## 2023-10-22 DIAGNOSIS — M25551 Pain in right hip: Secondary | ICD-10-CM

## 2023-10-22 DIAGNOSIS — R252 Cramp and spasm: Secondary | ICD-10-CM

## 2023-10-22 DIAGNOSIS — M6281 Muscle weakness (generalized): Secondary | ICD-10-CM

## 2023-10-22 DIAGNOSIS — R262 Difficulty in walking, not elsewhere classified: Secondary | ICD-10-CM

## 2023-10-22 DIAGNOSIS — R293 Abnormal posture: Secondary | ICD-10-CM
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
## 2023-10-24 ENCOUNTER — Ambulatory Visit (HOSPITAL_COMMUNITY)
Admission: RE | Admit: 2023-10-24 | Discharge: 2023-10-24 | Disposition: A | Discharge status: Home / Self Care | Payer: Medicare Other | Source: Ambulatory Visit | Attending: Physician Assistant | Admitting: Physician Assistant

## 2023-10-24 VITALS — BP 150/84 | HR 71 | Ht 63.0 in | Wt 129.0 lb

## 2023-10-24 DIAGNOSIS — I10 Essential (primary) hypertension: Secondary | ICD-10-CM | POA: Diagnosis not present

## 2023-10-24 DIAGNOSIS — I480 Paroxysmal atrial fibrillation: Secondary | ICD-10-CM | POA: Diagnosis present

## 2023-10-24 DIAGNOSIS — E785 Hyperlipidemia, unspecified: Secondary | ICD-10-CM | POA: Insufficient documentation

## 2023-10-24 DIAGNOSIS — Z79899 Other long term (current) drug therapy: Secondary | ICD-10-CM | POA: Diagnosis not present

## 2023-10-24 DIAGNOSIS — D6869 Other thrombophilia: Secondary | ICD-10-CM | POA: Insufficient documentation

## 2023-10-24 DIAGNOSIS — Z853 Personal history of malignant neoplasm of breast: Secondary | ICD-10-CM | POA: Insufficient documentation

## 2023-10-24 DIAGNOSIS — Z5181 Encounter for therapeutic drug level monitoring: Secondary | ICD-10-CM | POA: Diagnosis not present

## 2023-10-24 DIAGNOSIS — Z7901 Long term (current) use of anticoagulants: Secondary | ICD-10-CM | POA: Insufficient documentation

## 2023-10-24 DIAGNOSIS — G4733 Obstructive sleep apnea (adult) (pediatric): Secondary | ICD-10-CM | POA: Diagnosis not present
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
## 2023-10-29 ENCOUNTER — Ambulatory Visit: Payer: Medicare Other

## 2023-10-29 DIAGNOSIS — M25551 Pain in right hip: Secondary | ICD-10-CM | POA: Diagnosis not present

## 2023-10-29 DIAGNOSIS — M6281 Muscle weakness (generalized): Secondary | ICD-10-CM

## 2023-10-29 DIAGNOSIS — R252 Cramp and spasm: Secondary | ICD-10-CM

## 2023-10-29 DIAGNOSIS — R262 Difficulty in walking, not elsewhere classified: Secondary | ICD-10-CM

## 2023-10-29 DIAGNOSIS — R293 Abnormal posture: Secondary | ICD-10-CM
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
## 2023-10-31 ENCOUNTER — Ambulatory Visit: Payer: Medicare Other

## 2023-10-31 DIAGNOSIS — R293 Abnormal posture: Secondary | ICD-10-CM

## 2023-10-31 DIAGNOSIS — M25551 Pain in right hip: Secondary | ICD-10-CM

## 2023-10-31 DIAGNOSIS — M6281 Muscle weakness (generalized): Secondary | ICD-10-CM

## 2023-10-31 DIAGNOSIS — R262 Difficulty in walking, not elsewhere classified: Secondary | ICD-10-CM

## 2023-10-31 DIAGNOSIS — R252 Cramp and spasm: Secondary | ICD-10-CM
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
## 2023-11-05 ENCOUNTER — Telehealth: Payer: Self-pay | Admitting: *Deleted

## 2023-11-05 NOTE — Telephone Encounter (Signed)
   Pre-operative Risk Assessment    Patient Name: Nancy Oconnell  DOB: 1946-07-01 MRN: [id number]   Date of last office visit: 04/16/23 DR. CAMNITZ Date of next office visit: NONE   Request for Surgical Clearance    Procedure:   RIGHT HIP TOTAL ARTHROPLASTY  Date of Surgery:  Clearance 12/09/23 (PER FORM: STAT STAT STAT)                               Surgeon:  DR. Darnell Level Surgeon's Group or Practice Name:  [facility] Phone number:  [phone] Fax number:  [phone] ATTN: Riccardo Dubin   Type of Clearance Requested:   - Medical  - Pharmacy:  Hold Apixaban (Eliquis)     Type of Anesthesia:  Not Indicated   Additional requests/questions:    Elpidio Anis   11/05/2023, 5:45 PM

## 2023-11-07 NOTE — Telephone Encounter (Signed)
   Name: Nancy Oconnell  DOB: 11-Oct-1945  MRN: [id number]  Primary Cardiologist: Bryan Lemma, MD   Preoperative team, please contact this patient and set up a phone call appointment for further preoperative risk assessment. Please obtain consent and complete medication review. Thank you for your help.  I confirm that guidance regarding antiplatelet and oral anticoagulation therapy has been completed and, if necessary, noted below.  Per office protocol, patient can hold Eliquis for 3 days prior to procedure.    I also confirmed the patient resides in the state of [place]. As per [facility] Medical Board telemedicine laws, the patient must reside in the state in which the provider is licensed.   Denyce Robert, NP 11/07/2023, 2:16 PM [facility]

## 2023-11-07 NOTE — Telephone Encounter (Signed)
 Patient with diagnosis of afib on Eliquis for anticoagulation.    Procedure: RIGHT HIP TOTAL ARTHROPLASTY  Date of procedure: 12/09/23   CHA2DS2-VASc Score = 4   This indicates a 4.8% annual risk of stroke. The patient's score is based upon: CHF History: 0 HTN History: 1 Diabetes History: 0 Stroke History: 0 Vascular Disease History: 0 Age Score: 2 Gender Score: 1      CrCl 76 ml/min Platelet count 209  Per office protocol, patient can hold Eliquis for 3 days prior to procedure.    **This guidance is not considered finalized until pre-operative APP has relayed final recommendations.**

## 2023-11-07 NOTE — Telephone Encounter (Signed)
1st attempt to reach pt regarding surgical clearance and the need for a tele visit.  Left a message for pt to call back and ask for the preop team. 

## 2023-11-08 ENCOUNTER — Telehealth: Payer: Self-pay

## 2023-11-08 NOTE — Telephone Encounter (Signed)
 Called patient to set up a Tele visit for a pre-op clearance. Patient mention that her surgery is on 12/09/23, but her surgeon mention that she needs a clearance not later then 11/13/23. Patient appt is on 11/13/23 @ 2:40, Meds rec and consent done.[name] on to use this spot.

## 2023-11-08 NOTE — Telephone Encounter (Signed)
 Called patient to set up a Tele visit for a pre-op clearance. Patient mention that her surgery is on 12/09/23, but her surgeon mention that she needs a clearance not later then 11/13/23. Patient appt is on 11/13/23 @ 2:40, Meds rec and consent done.[name] on to use this spot.      Patient Consent for Virtual Visit        [name] has provided verbal consent on 11/08/2023 for a virtual visit (video or telephone).   CONSENT FOR VIRTUAL VISIT FOR:  Nancy Oconnell  By participating in this virtual visit I agree to the following:  I hereby voluntarily request, consent and authorize [facility] and its employed or contracted physicians, physician assistants, nurse practitioners or other licensed health care professionals (the Practitioner), to provide me with telemedicine health care services (the "Services") as deemed necessary by the treating Practitioner. I acknowledge and consent to receive the Services by the Practitioner via telemedicine. I understand that the telemedicine visit will involve communicating with the Practitioner through live audiovisual communication technology and the disclosure of certain medical information by electronic transmission. I acknowledge that I have been given the opportunity to request an in-person assessment or other available alternative prior to the telemedicine visit and am voluntarily participating in the telemedicine visit.  I understand that I have the right to withhold or withdraw my consent to the use of telemedicine in the course of my care at any time, without affecting my right to future care or treatment, and that the Practitioner or I may terminate the telemedicine visit at any time. I understand that I have the right to inspect all information obtained and/or recorded in the course of the telemedicine visit and may receive copies of available information for a reasonable fee.  I understand that some of the potential risks of receiving the  Services via telemedicine include:  Delay or interruption in medical evaluation due to technological equipment failure or disruption; Information transmitted may not be sufficient (e.g. poor resolution of images) to allow for appropriate medical decision making by the Practitioner; and/or  In rare instances, security protocols could fail, causing a breach of personal health information.  Furthermore, I acknowledge that it is my responsibility to provide information about my medical history, conditions and care that is complete and accurate to the best of my ability. I acknowledge that Practitioner's advice, recommendations, and/or decision may be based on factors not within their control, such as incomplete or inaccurate data provided by me or distortions of diagnostic images or specimens that may result from electronic transmissions. I understand that the practice of medicine is not an exact science and that Practitioner makes no warranties or guarantees regarding treatment outcomes. I acknowledge that a copy of this consent can be made available to me via my patient portal [facility] MyChart), or I can request a printed copy by calling the office of [facility].    I understand that my insurance will be billed for this visit.   I have read or had this consent read to me. I understand the contents of this consent, which adequately explains the benefits and risks of the Services being provided via telemedicine.  I have been provided ample opportunity to ask questions regarding this consent and the Services and have had my questions answered to my satisfaction. I give my informed consent for the services to be provided through the use of telemedicine in my medical care

## 2023-11-08 NOTE — Telephone Encounter (Signed)
Patient returned Pre-op call. 

## 2023-11-13 ENCOUNTER — Ambulatory Visit: Payer: Medicare Other | Attending: Nurse Practitioner

## 2023-11-13 DIAGNOSIS — Z01810 Encounter for preprocedural cardiovascular examination: Secondary | ICD-10-CM | POA: Diagnosis present
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
## 2024-01-02 ENCOUNTER — Other Ambulatory Visit (HOSPITAL_COMMUNITY): Payer: Self-pay | Admitting: Physician Assistant

## 2024-01-24 ENCOUNTER — Other Ambulatory Visit: Payer: Self-pay | Admitting: Cardiology

## 2024-01-27 NOTE — Telephone Encounter (Signed)
 Prescription refill request for Eliquis  received. Indication: PAF Last office visit: 10/24/23  C Fenton PA Scr: 0.54 on 12/10/23 Age: 78 Weight: 58.5kg  Based on above findings Eliquis  5mg  twice daily is the appropriate dose.  Refill approved.

## 2024-03-03 ENCOUNTER — Encounter (HOSPITAL_COMMUNITY): Payer: Self-pay
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
## 2024-05-06 NOTE — Telephone Encounter (Signed)
 COVER SHEET 05/06/2024  To: Nancy Oconnell   Please see the attached referral and records.  Thank you,   Nancy Norleen Parkinson, MD    The documents accompanying this electronic transmission contain confidential information which is legally privileged and which belongs to the sender. This information is intended only for the use of the individuals or entity named above.  The authorized recipient of this information is prohibited from disclosing this information to any other party.  If you are not the intended recipient, you are hereby notified that any disclosure, copying, distribution, or action taken in reliance on the contents of these documents is strictly prohibited.  If you received this electronic transmission in error, please notify the sender immediately to arrange for the return or destruction of these documents.  Any faxes that are accidently misdirected must be reported to the [facility] Privacy Office using their online reporting system:  [email or web address]. To speak with someone in the Privacy Office, call [phone] or email [email or web address] (link: [email or web address]).                            05/06/2024  Hello,  Please see the attached referral for patient Nancy Oconnell.  Please utilize the demographics listed below to contact the patient directly to schedule their appointment.   Nancy Oconnell [birth year] [address] [place] [address] Telephone Information:  Mobile [phone]   There are no phone numbers on file.   Insurance InformationHydrographic surveyor as of 05/06/2024     Primary Coverage     Payor Plan Insurance Group Employer/Plan Group   MEDICARE MEDICARE PART A AND PART B      Payor Plan Address Payor Plan Phone Number Payor Plan Fax Number Effective Dates   [address] [phone]  10/11/2012 - None Entered   [place] [address]       Subscriber Name Subscriber  Birth Date Member ID     Nancy Oconnell, Nancy Oconnell [birth year] [id number]          Secondary Coverage     Payor Plan Insurance Group Employer/Plan Group   BCBS BCBS MCARE SUPPLEMENT ([place]) [id number]     Payor Plan Address Payor Plan Phone Number Payor Plan Fax Number Effective Dates   [phone]  09/11/2023 - None [place] [address]       Subscriber Name Subscriber Birth Date Member ID     Nancy Oconnell, Nancy Oconnell 1945-10-24 [id number]             Authorization Number: Not Required  Below you will find the Physical Therapy referral and last office visit.   If you could please fax back the date, time, and provider the patient has been scheduled with to None.  As well as, fax the office note after the patient has been seen.  Please don't hesitate to contact our office with any questions.   Thank you,  Nancy Oconnell, Referral Coordinator   PCP: Nancy Lin Canon, MD  Appointment Date:_____________ Appointment Time:_____________  If unable to schedule, please choose the appropriate reason:   Unable to contact the patient after 3 attempts. Patient declined scheduling. Patient does not meet criteria.

## 2024-06-03 ENCOUNTER — Ambulatory Visit (HOSPITAL_COMMUNITY)
Admission: RE | Admit: 2024-06-03 | Discharge: 2024-06-03 | Disposition: A | Discharge status: Home / Self Care | Source: Ambulatory Visit | Attending: Physician Assistant | Admitting: Physician Assistant

## 2024-06-03 VITALS — BP 152/82 | HR 63 | Ht 63.0 in | Wt 128.2 lb

## 2024-06-03 DIAGNOSIS — I480 Paroxysmal atrial fibrillation: Secondary | ICD-10-CM | POA: Diagnosis present

## 2024-06-03 DIAGNOSIS — Z79899 Other long term (current) drug therapy: Secondary | ICD-10-CM | POA: Diagnosis present

## 2024-06-03 DIAGNOSIS — I4891 Unspecified atrial fibrillation: Secondary | ICD-10-CM | POA: Diagnosis not present

## 2024-06-03 DIAGNOSIS — D6869 Other thrombophilia: Secondary | ICD-10-CM | POA: Diagnosis present

## 2024-06-03 DIAGNOSIS — Z5181 Encounter for therapeutic drug level monitoring: Secondary | ICD-10-CM | POA: Diagnosis present
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
## 2024-07-11 ENCOUNTER — Other Ambulatory Visit (HOSPITAL_COMMUNITY): Payer: Self-pay | Admitting: Physician Assistant

## 2024-09-17 ENCOUNTER — Other Ambulatory Visit (HOSPITAL_COMMUNITY): Payer: Self-pay | Admitting: Physician Assistant

## 2024-09-24 ENCOUNTER — Other Ambulatory Visit (HOSPITAL_COMMUNITY): Payer: Self-pay | Admitting: Physician Assistant

## 2024-12-01 ENCOUNTER — Ambulatory Visit (HOSPITAL_COMMUNITY): Admitting: Physician Assistant
